# Patient Record
Sex: Male | Born: 1970 | Race: White | Hispanic: No | Marital: Single | Smoking: Never smoker
Health system: Southern US, Community
[De-identification: ages and names within clinical notes are randomized; demographics above are authoritative.]

## PROBLEM LIST (undated history)

## (undated) DIAGNOSIS — R569 Unspecified convulsions: Secondary | ICD-10-CM

## (undated) DIAGNOSIS — E7800 Pure hypercholesterolemia, unspecified: Secondary | ICD-10-CM

## (undated) DIAGNOSIS — M1990 Unspecified osteoarthritis, unspecified site: Secondary | ICD-10-CM

## (undated) DIAGNOSIS — I10 Essential (primary) hypertension: Secondary | ICD-10-CM

## (undated) DIAGNOSIS — K760 Fatty (change of) liver, not elsewhere classified: Secondary | ICD-10-CM

## (undated) DIAGNOSIS — G809 Cerebral palsy, unspecified: Secondary | ICD-10-CM

## (undated) DIAGNOSIS — M109 Gout, unspecified: Secondary | ICD-10-CM

## (undated) DIAGNOSIS — G40909 Epilepsy, unspecified, not intractable, without status epilepticus: Secondary | ICD-10-CM

## (undated) DIAGNOSIS — F419 Anxiety disorder, unspecified: Secondary | ICD-10-CM

## (undated) HISTORY — PX: NOSE SURGERY: SHX723

## (undated) HISTORY — PX: ELBOW SURGERY: SHX618

## (undated) HISTORY — PX: BRAIN SURGERY: SHX531

## (undated) HISTORY — PX: LEG SURGERY: SHX1003

## (undated) HISTORY — PX: TONSILLECTOMY AND ADENOIDECTOMY: SHX28

## (undated) HISTORY — PX: THROAT SURGERY: SHX803

---

## 2011-01-27 DIAGNOSIS — G40209 Localization-related (focal) (partial) symptomatic epilepsy and epileptic syndromes with complex partial seizures, not intractable, without status epilepticus: Secondary | ICD-10-CM | POA: Insufficient documentation

## 2020-04-18 HISTORY — PX: COLONOSCOPY: SHX174

## 2020-10-07 ENCOUNTER — Emergency Department (HOSPITAL_COMMUNITY): Payer: Medicare Other

## 2020-10-07 ENCOUNTER — Inpatient Hospital Stay (HOSPITAL_COMMUNITY)
Admission: EM | Admit: 2020-10-07 | Discharge: 2020-10-12 | DRG: 481 | Disposition: A | Discharge status: Home Health | Payer: Medicare Other | Attending: Student | Admitting: Student

## 2020-10-07 ENCOUNTER — Other Ambulatory Visit: Payer: Self-pay

## 2020-10-07 DIAGNOSIS — D62 Acute posthemorrhagic anemia: Secondary | ICD-10-CM | POA: Diagnosis not present

## 2020-10-07 DIAGNOSIS — E7800 Pure hypercholesterolemia, unspecified: Secondary | ICD-10-CM | POA: Diagnosis present

## 2020-10-07 DIAGNOSIS — I10 Essential (primary) hypertension: Secondary | ICD-10-CM | POA: Diagnosis present

## 2020-10-07 DIAGNOSIS — Z20822 Contact with and (suspected) exposure to covid-19: Secondary | ICD-10-CM | POA: Diagnosis present

## 2020-10-07 DIAGNOSIS — M25511 Pain in right shoulder: Secondary | ICD-10-CM | POA: Diagnosis present

## 2020-10-07 DIAGNOSIS — T148XXA Other injury of unspecified body region, initial encounter: Secondary | ICD-10-CM

## 2020-10-07 DIAGNOSIS — T1490XA Injury, unspecified, initial encounter: Secondary | ICD-10-CM

## 2020-10-07 DIAGNOSIS — Z79899 Other long term (current) drug therapy: Secondary | ICD-10-CM

## 2020-10-07 DIAGNOSIS — G809 Cerebral palsy, unspecified: Secondary | ICD-10-CM | POA: Diagnosis present

## 2020-10-07 DIAGNOSIS — S72422A Displaced fracture of lateral condyle of left femur, initial encounter for closed fracture: Secondary | ICD-10-CM

## 2020-10-07 DIAGNOSIS — S7290XA Unspecified fracture of unspecified femur, initial encounter for closed fracture: Secondary | ICD-10-CM | POA: Diagnosis present

## 2020-10-07 DIAGNOSIS — G40909 Epilepsy, unspecified, not intractable, without status epilepticus: Secondary | ICD-10-CM | POA: Diagnosis present

## 2020-10-07 DIAGNOSIS — S72302A Unspecified fracture of shaft of left femur, initial encounter for closed fracture: Principal | ICD-10-CM | POA: Diagnosis present

## 2020-10-07 DIAGNOSIS — Z419 Encounter for procedure for purposes other than remedying health state, unspecified: Secondary | ICD-10-CM

## 2020-10-07 DIAGNOSIS — S72462A Displaced supracondylar fracture with intracondylar extension of lower end of left femur, initial encounter for closed fracture: Secondary | ICD-10-CM | POA: Diagnosis not present

## 2020-10-07 DIAGNOSIS — Y92410 Unspecified street and highway as the place of occurrence of the external cause: Secondary | ICD-10-CM

## 2020-10-07 HISTORY — DX: Essential (primary) hypertension: I10

## 2020-10-07 HISTORY — DX: Unspecified convulsions: R56.9

## 2020-10-07 HISTORY — DX: Cerebral palsy, unspecified: G80.9

## 2020-10-07 HISTORY — DX: Pure hypercholesterolemia, unspecified: E78.00

## 2020-10-07 HISTORY — DX: Epilepsy, unspecified, not intractable, without status epilepticus: G40.909

## 2020-10-07 LAB — COMPREHENSIVE METABOLIC PANEL
ALT: 104 U/L — ABNORMAL HIGH (ref 0–44)
AST: 187 U/L — ABNORMAL HIGH (ref 15–41)
Albumin: 3.3 g/dL — ABNORMAL LOW (ref 3.5–5.0)
Alkaline Phosphatase: 123 U/L (ref 38–126)
Anion gap: 8 (ref 5–15)
BUN: 10 mg/dL (ref 6–20)
CO2: 20 mmol/L — ABNORMAL LOW (ref 22–32)
Calcium: 8.3 mg/dL — ABNORMAL LOW (ref 8.9–10.3)
Chloride: 111 mmol/L (ref 98–111)
Creatinine, Ser: 0.92 mg/dL (ref 0.61–1.24)
GFR, Estimated: >60 mL/min (ref >60)
Glucose, Bld: 131 mg/dL — ABNORMAL HIGH (ref 70–99)
Potassium: 3.6 mmol/L (ref 3.5–5.1)
Sodium: 139 mmol/L (ref 135–145)
Total Bilirubin: 0.7 mg/dL (ref 0.3–1.2)
Total Protein: 6.4 g/dL — ABNORMAL LOW (ref 6.5–8.1)

## 2020-10-07 LAB — I-STAT CHEM 8, ED
BUN: 10 mg/dL (ref 6–20)
Calcium, Ion: 1.08 mmol/L — ABNORMAL LOW (ref 1.15–1.40)
Chloride: 109 mmol/L (ref 98–111)
Creatinine, Ser: 0.8 mg/dL (ref 0.61–1.24)
Glucose, Bld: 131 mg/dL — ABNORMAL HIGH (ref 70–99)
HCT: 41 % (ref 39.0–52.0)
Hemoglobin: 13.9 g/dL (ref 13.0–17.0)
Potassium: 3.8 mmol/L (ref 3.5–5.1)
Sodium: 143 mmol/L (ref 135–145)
TCO2: 23 mmol/L (ref 22–32)

## 2020-10-07 LAB — URINALYSIS, ROUTINE W REFLEX MICROSCOPIC
Bacteria, UA: NONE SEEN
Bilirubin Urine: NEGATIVE
Glucose, UA: NEGATIVE mg/dL
Ketones, ur: NEGATIVE mg/dL
Leukocytes,Ua: NEGATIVE
Nitrite: NEGATIVE
Protein, ur: NEGATIVE mg/dL
Specific Gravity, Urine: >1.046 — ABNORMAL HIGH (ref 1.005–1.030)
pH: 5 (ref 5.0–8.0)

## 2020-10-07 LAB — CBC
HCT: 41.4 % (ref 39.0–52.0)
Hemoglobin: 13.5 g/dL (ref 13.0–17.0)
MCH: 31 pg (ref 26.0–34.0)
MCHC: 32.6 g/dL (ref 30.0–36.0)
MCV: 95.2 fL (ref 80.0–100.0)
Platelets: 205 10*3/uL (ref 150–400)
RBC: 4.35 MIL/uL (ref 4.22–5.81)
RDW: 11.9 % (ref 11.5–15.5)
WBC: 15.3 10*3/uL — ABNORMAL HIGH (ref 4.0–10.5)
nRBC: 0 % (ref 0.0–0.2)

## 2020-10-07 LAB — PROTIME-INR
INR: 1.2 (ref 0.8–1.2)
Prothrombin Time: 15 seconds (ref 11.4–15.2)

## 2020-10-07 LAB — SAMPLE TO BLOOD BANK

## 2020-10-07 LAB — RESP PANEL BY RT-PCR (FLU A&B, COVID) ARPGX2
Influenza A by PCR: NEGATIVE
Influenza B by PCR: NEGATIVE
SARS Coronavirus 2 by RT PCR: NEGATIVE

## 2020-10-07 LAB — ETHANOL: Alcohol, Ethyl (B): <10 mg/dL (ref <10)

## 2020-10-07 MED ORDER — HEPARIN SODIUM (PORCINE) 5000 UNIT/ML IJ SOLN: 5000.0000 [IU] | Freq: Three times a day (TID) | INTRAMUSCULAR | Status: DC

## 2020-10-07 MED ORDER — HYDROCODONE-ACETAMINOPHEN 5-325 MG PO TABS: 1.0000 | ORAL_TABLET | ORAL | Status: DC | PRN

## 2020-10-07 MED ORDER — MORPHINE SULFATE (PF) 2 MG/ML IV SOLN
0.5000 mg | INTRAVENOUS | Status: DC | PRN
  Administered 2020-10-07 – 2020-10-08 (×4): 1 mg via INTRAVENOUS
  Filled 2020-10-07 (×4): qty 1

## 2020-10-07 MED ORDER — TRANEXAMIC ACID-NACL 1000-0.7 MG/100ML-% IV SOLN
1000.0000 mg | INTRAVENOUS | Status: AC
  Administered 2020-10-08: 1000 mg via INTRAVENOUS
  Filled 2020-10-07: qty 100

## 2020-10-07 MED ORDER — LACTATED RINGERS IV BOLUS
1000.0000 mL | Freq: Once | INTRAVENOUS | Status: AC
  Administered 2020-10-07: 1000 mL via INTRAVENOUS

## 2020-10-07 MED ORDER — SODIUM CHLORIDE 0.9 % IV SOLN: INTRAVENOUS | Status: DC

## 2020-10-07 MED ORDER — METHOCARBAMOL 500 MG PO TABS
500.0000 mg | ORAL_TABLET | Freq: Four times a day (QID) | ORAL | Status: DC | PRN
  Administered 2020-10-08 – 2020-10-11 (×3): 500 mg via ORAL
  Filled 2020-10-07 (×3): qty 1

## 2020-10-07 MED ORDER — POVIDONE-IODINE 10 % EX SWAB: 2.0000 "application " | Freq: Once | CUTANEOUS | Status: DC

## 2020-10-07 MED ORDER — METHOCARBAMOL 1000 MG/10ML IJ SOLN: 500.0000 mg | Freq: Four times a day (QID) | INTRAVENOUS | Status: DC | PRN

## 2020-10-07 MED ORDER — HYDROMORPHONE HCL 1 MG/ML IJ SOLN
1.0000 mg | INTRAMUSCULAR | Status: AC | PRN
  Administered 2020-10-07 – 2020-10-08 (×3): 1 mg via INTRAVENOUS
  Filled 2020-10-07 (×3): qty 1

## 2020-10-07 MED ORDER — CHLORHEXIDINE GLUCONATE 4 % EX LIQD: 60.0000 mL | Freq: Once | CUTANEOUS | Status: DC

## 2020-10-07 MED ORDER — HYDROMORPHONE HCL 1 MG/ML IJ SOLN
1.0000 mg | Freq: Once | INTRAMUSCULAR | Status: AC
Start: 2020-10-07 — End: 2020-10-07
  Administered 2020-10-07: 1 mg via INTRAVENOUS
  Filled 2020-10-07: qty 1

## 2020-10-07 MED ORDER — CEFAZOLIN SODIUM-DEXTROSE 2-4 GM/100ML-% IV SOLN
2.0000 g | INTRAVENOUS | Status: AC
  Administered 2020-10-08: 2 g via INTRAVENOUS
  Filled 2020-10-07: qty 100

## 2020-10-07 MED ORDER — HEPARIN SODIUM (PORCINE) 5000 UNIT/ML IJ SOLN
5000.0000 [IU] | Freq: Three times a day (TID) | INTRAMUSCULAR | Status: DC
  Administered 2020-10-08: 5000 [IU] via SUBCUTANEOUS
  Filled 2020-10-07: qty 1

## 2020-10-07 MED ORDER — CELECOXIB 200 MG PO CAPS
200.0000 mg | ORAL_CAPSULE | Freq: Two times a day (BID) | ORAL | Status: DC
  Administered 2020-10-07 – 2020-10-12 (×10): 200 mg via ORAL
  Filled 2020-10-07 (×11): qty 1

## 2020-10-07 MED ORDER — IOHEXOL 300 MG/ML  SOLN
100.0000 mL | Freq: Once | INTRAMUSCULAR | Status: AC | PRN
  Administered 2020-10-07: 100 mL via INTRAVENOUS
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
## 2020-10-07 NOTE — ED Notes (Signed)
Orthopedic surgeon, Dr. Berdine Addison, cleared c-spine and removed c-collar from patient.

## 2020-10-07 NOTE — ED Notes (Signed)
CT notified of need for scans

## 2020-10-07 NOTE — Progress Notes (Signed)
Orthopedic Tech Progress Note Patient Details:  Charles Brady 07-31-1970 [id number]  Ortho Devices Type of Ortho Device: Shoulder immobilizer, Knee Immobilizer Ortho Device/Splint Location: LLE,RUE Ortho Device/Splint Interventions: Ordered, Application, Adjustment   Post Interventions Patient Tolerated: Fair Instructions Provided: Adjustment of device, Care of device, Poper ambulation with device  Zaydin Billey 10/07/2020, 4:05 PM

## 2020-10-07 NOTE — ED Notes (Signed)
Pt's family updated per his request.

## 2020-10-07 NOTE — ED Notes (Signed)
Patient transported to CT 
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
## 2020-10-07 NOTE — ED Notes (Signed)
Pt given drink. Family at bedisde.

## 2020-10-08 ENCOUNTER — Inpatient Hospital Stay (HOSPITAL_COMMUNITY): Payer: Medicare Other | Admitting: Certified Registered"

## 2020-10-08 ENCOUNTER — Encounter (HOSPITAL_COMMUNITY)
Admission: EM | Disposition: A | Discharge status: Home Health | Payer: Self-pay | Source: Home / Self Care | Attending: Student

## 2020-10-08 ENCOUNTER — Encounter (HOSPITAL_COMMUNITY): Payer: Self-pay | Admitting: Orthopedic Surgery

## 2020-10-08 ENCOUNTER — Inpatient Hospital Stay (HOSPITAL_COMMUNITY): Payer: Medicare Other

## 2020-10-08 HISTORY — PX: FEMUR IM NAIL: SHX1597

## 2020-10-08 LAB — RAPID URINE DRUG SCREEN, HOSP PERFORMED
Amphetamines: NOT DETECTED
Barbiturates: NOT DETECTED
Benzodiazepines: POSITIVE — AB
Cocaine: NOT DETECTED
Opiates: POSITIVE — AB
Tetrahydrocannabinol: POSITIVE — AB

## 2020-10-08 LAB — HIV ANTIBODY (ROUTINE TESTING W REFLEX): HIV Screen 4th Generation wRfx: NONREACTIVE

## 2020-10-08 SURGERY — INSERTION, INTRAMEDULLARY ROD, FEMUR, RETROGRADE
Anesthesia: General | Laterality: Left

## 2020-10-08 MED ORDER — EPHEDRINE 5 MG/ML INJ
INTRAVENOUS | Status: AC
  Filled 2020-10-08: qty 10

## 2020-10-08 MED ORDER — POTASSIUM CHLORIDE IN NACL 20-0.9 MEQ/L-% IV SOLN
INTRAVENOUS | Status: DC
  Filled 2020-10-08: qty 1000

## 2020-10-08 MED ORDER — FENTANYL CITRATE (PF) 250 MCG/5ML IJ SOLN
INTRAMUSCULAR | Status: DC | PRN
  Administered 2020-10-08: 25 ug via INTRAVENOUS
  Administered 2020-10-08 (×2): 50 ug via INTRAVENOUS
  Administered 2020-10-08: 25 ug via INTRAVENOUS
  Administered 2020-10-08: 100 ug via INTRAVENOUS

## 2020-10-08 MED ORDER — LAMOTRIGINE 100 MG PO TABS
200.0000 mg | ORAL_TABLET | ORAL | Status: AC
  Administered 2020-10-08: 200 mg via ORAL
  Filled 2020-10-08: qty 2

## 2020-10-08 MED ORDER — PHENYLEPHRINE 40 MCG/ML (10ML) SYRINGE FOR IV PUSH (FOR BLOOD PRESSURE SUPPORT)
PREFILLED_SYRINGE | INTRAVENOUS | Status: AC
  Filled 2020-10-08: qty 20

## 2020-10-08 MED ORDER — PHENYLEPHRINE 40 MCG/ML (10ML) SYRINGE FOR IV PUSH (FOR BLOOD PRESSURE SUPPORT)
PREFILLED_SYRINGE | INTRAVENOUS | Status: DC | PRN
  Administered 2020-10-08: 40 ug via INTRAVENOUS
  Administered 2020-10-08 (×3): 120 ug via INTRAVENOUS

## 2020-10-08 MED ORDER — ACETAMINOPHEN 500 MG PO TABS
ORAL_TABLET | ORAL | Status: AC
  Administered 2020-10-08: 1000 mg via ORAL
  Filled 2020-10-08: qty 2

## 2020-10-08 MED ORDER — LAMOTRIGINE 100 MG PO TABS
200.0000 mg | ORAL_TABLET | Freq: Two times a day (BID) | ORAL | Status: DC
  Administered 2020-10-08 – 2020-10-12 (×8): 200 mg via ORAL
  Filled 2020-10-08 (×10): qty 2

## 2020-10-08 MED ORDER — LEVETIRACETAM 500 MG PO TABS
1000.0000 mg | ORAL_TABLET | ORAL | Status: AC
  Administered 2020-10-08: 1000 mg via ORAL
  Filled 2020-10-08: qty 2

## 2020-10-08 MED ORDER — LACTATED RINGERS IV SOLN: INTRAVENOUS | Status: DC | PRN

## 2020-10-08 MED ORDER — DEXAMETHASONE SODIUM PHOSPHATE 10 MG/ML IJ SOLN
INTRAMUSCULAR | Status: DC | PRN
  Administered 2020-10-08: 5 mg via INTRAVENOUS

## 2020-10-08 MED ORDER — OXYCODONE HCL 5 MG PO TABS
5.0000 mg | ORAL_TABLET | ORAL | Status: DC | PRN
  Administered 2020-10-09: 10 mg via ORAL
  Administered 2020-10-09: 5 mg via ORAL
  Administered 2020-10-09: 10 mg via ORAL
  Administered 2020-10-10: 5 mg via ORAL
  Administered 2020-10-11 – 2020-10-12 (×3): 10 mg via ORAL
  Filled 2020-10-08: qty 2
  Filled 2020-10-08 (×2): qty 1
  Filled 2020-10-08 (×4): qty 2

## 2020-10-08 MED ORDER — ROCURONIUM BROMIDE 10 MG/ML (PF) SYRINGE
PREFILLED_SYRINGE | INTRAVENOUS | Status: DC | PRN
  Administered 2020-10-08: 20 mg via INTRAVENOUS
  Administered 2020-10-08: 80 mg via INTRAVENOUS

## 2020-10-08 MED ORDER — 0.9 % SODIUM CHLORIDE (POUR BTL) OPTIME
TOPICAL | Status: DC | PRN
  Administered 2020-10-08: 1000 mL

## 2020-10-08 MED ORDER — GABAPENTIN 300 MG PO CAPS
600.0000 mg | ORAL_CAPSULE | Freq: Three times a day (TID) | ORAL | Status: DC
  Administered 2020-10-08 – 2020-10-12 (×11): 600 mg via ORAL
  Filled 2020-10-08 (×11): qty 2

## 2020-10-08 MED ORDER — METOCLOPRAMIDE HCL 5 MG/ML IJ SOLN
5.0000 mg | Freq: Three times a day (TID) | INTRAMUSCULAR | Status: DC | PRN
Start: 2020-10-08 — End: 2020-10-12

## 2020-10-08 MED ORDER — ONDANSETRON HCL 4 MG/2ML IJ SOLN
INTRAMUSCULAR | Status: AC
  Filled 2020-10-08: qty 2

## 2020-10-08 MED ORDER — DEXMEDETOMIDINE HCL IN NACL 200 MCG/50ML IV SOLN
INTRAVENOUS | Status: AC
  Filled 2020-10-08: qty 50

## 2020-10-08 MED ORDER — ACETAMINOPHEN 500 MG PO TABS
1000.0000 mg | ORAL_TABLET | Freq: Four times a day (QID) | ORAL | Status: DC
  Administered 2020-10-08 – 2020-10-12 (×13): 1000 mg via ORAL
  Filled 2020-10-08 (×14): qty 2

## 2020-10-08 MED ORDER — PRAVASTATIN SODIUM 40 MG PO TABS
20.0000 mg | ORAL_TABLET | Freq: Every day | ORAL | Status: DC
  Administered 2020-10-08 – 2020-10-11 (×4): 20 mg via ORAL
  Filled 2020-10-08 (×4): qty 1

## 2020-10-08 MED ORDER — CEFAZOLIN SODIUM-DEXTROSE 2-4 GM/100ML-% IV SOLN
2.0000 g | Freq: Three times a day (TID) | INTRAVENOUS | Status: AC
  Administered 2020-10-08 – 2020-10-09 (×3): 2 g via INTRAVENOUS
  Filled 2020-10-08 (×3): qty 100

## 2020-10-08 MED ORDER — ROCURONIUM BROMIDE 10 MG/ML (PF) SYRINGE
PREFILLED_SYRINGE | INTRAVENOUS | Status: AC
  Filled 2020-10-08: qty 20

## 2020-10-08 MED ORDER — PROPOFOL 10 MG/ML IV BOLUS
INTRAVENOUS | Status: DC | PRN
  Administered 2020-10-08: 200 mg via INTRAVENOUS

## 2020-10-08 MED ORDER — LEVETIRACETAM 500 MG PO TABS
1000.0000 mg | ORAL_TABLET | Freq: Two times a day (BID) | ORAL | Status: DC
  Administered 2020-10-08 – 2020-10-12 (×8): 1000 mg via ORAL
  Filled 2020-10-08 (×8): qty 2

## 2020-10-08 MED ORDER — ONDANSETRON HCL 4 MG PO TABS: 4.0000 mg | ORAL_TABLET | Freq: Four times a day (QID) | ORAL | Status: DC | PRN

## 2020-10-08 MED ORDER — DOCUSATE SODIUM 100 MG PO CAPS
100.0000 mg | ORAL_CAPSULE | Freq: Two times a day (BID) | ORAL | Status: DC
  Administered 2020-10-08 – 2020-10-12 (×8): 100 mg via ORAL
  Filled 2020-10-08 (×8): qty 1

## 2020-10-08 MED ORDER — METOCLOPRAMIDE HCL 5 MG PO TABS: 5.0000 mg | ORAL_TABLET | Freq: Three times a day (TID) | ORAL | Status: DC | PRN

## 2020-10-08 MED ORDER — DEXAMETHASONE SODIUM PHOSPHATE 10 MG/ML IJ SOLN
INTRAMUSCULAR | Status: AC
  Filled 2020-10-08: qty 1

## 2020-10-08 MED ORDER — FENTANYL CITRATE (PF) 100 MCG/2ML IJ SOLN
INTRAMUSCULAR | Status: AC
  Administered 2020-10-08: 25 ug via INTRAVENOUS
  Filled 2020-10-08: qty 2

## 2020-10-08 MED ORDER — ONDANSETRON HCL 4 MG/2ML IJ SOLN
INTRAMUSCULAR | Status: DC | PRN
  Administered 2020-10-08: 4 mg via INTRAVENOUS

## 2020-10-08 MED ORDER — SUGAMMADEX SODIUM 200 MG/2ML IV SOLN
INTRAVENOUS | Status: DC | PRN
  Administered 2020-10-08 (×2): 100 mg via INTRAVENOUS

## 2020-10-08 MED ORDER — CEFAZOLIN SODIUM 1 G IJ SOLR
INTRAMUSCULAR | Status: AC
  Filled 2020-10-08: qty 20

## 2020-10-08 MED ORDER — LIDOCAINE 2% (20 MG/ML) 5 ML SYRINGE
INTRAMUSCULAR | Status: DC | PRN
  Administered 2020-10-08: 60 mg via INTRAVENOUS

## 2020-10-08 MED ORDER — SUCCINYLCHOLINE CHLORIDE 200 MG/10ML IV SOSY
PREFILLED_SYRINGE | INTRAVENOUS | Status: AC
  Filled 2020-10-08: qty 10

## 2020-10-08 MED ORDER — ACETAMINOPHEN 500 MG PO TABS: 1000.0000 mg | ORAL_TABLET | Freq: Once | ORAL | Status: AC

## 2020-10-08 MED ORDER — SCOPOLAMINE 1 MG/3DAYS TD PT72
MEDICATED_PATCH | TRANSDERMAL | Status: AC
  Filled 2020-10-08: qty 1

## 2020-10-08 MED ORDER — LIDOCAINE 2% (20 MG/ML) 5 ML SYRINGE
INTRAMUSCULAR | Status: AC
  Filled 2020-10-08: qty 15

## 2020-10-08 MED ORDER — FENTANYL CITRATE (PF) 100 MCG/2ML IJ SOLN
25.0000 ug | INTRAMUSCULAR | Status: DC | PRN
  Administered 2020-10-08: 50 ug via INTRAVENOUS
  Administered 2020-10-08: 25 ug via INTRAVENOUS

## 2020-10-08 MED ORDER — ONDANSETRON HCL 4 MG/2ML IJ SOLN: 4.0000 mg | Freq: Four times a day (QID) | INTRAMUSCULAR | Status: DC | PRN

## 2020-10-08 MED ORDER — EPHEDRINE SULFATE-NACL 50-0.9 MG/10ML-% IV SOSY
PREFILLED_SYRINGE | INTRAVENOUS | Status: DC | PRN
  Administered 2020-10-08 (×2): 10 mg via INTRAVENOUS

## 2020-10-08 MED ORDER — PHENYLEPHRINE HCL-NACL 20-0.9 MG/250ML-% IV SOLN
INTRAVENOUS | Status: DC | PRN
  Administered 2020-10-08: 50 ug/min via INTRAVENOUS

## 2020-10-08 MED ORDER — ALPRAZOLAM 0.5 MG PO TABS
1.0000 mg | ORAL_TABLET | Freq: Two times a day (BID) | ORAL | Status: DC
  Administered 2020-10-08 – 2020-10-12 (×8): 1 mg via ORAL
  Filled 2020-10-08 (×8): qty 2

## 2020-10-08 MED ORDER — CHLORHEXIDINE GLUCONATE 0.12 % MT SOLN
OROMUCOSAL | Status: AC
  Administered 2020-10-08: 15 mL
  Filled 2020-10-08: qty 15

## 2020-10-08 MED ORDER — BACLOFEN 10 MG PO TABS
10.0000 mg | ORAL_TABLET | Freq: Three times a day (TID) | ORAL | Status: DC
  Administered 2020-10-08 – 2020-10-12 (×11): 10 mg via ORAL
  Filled 2020-10-08 (×14): qty 1

## 2020-10-08 MED ORDER — PROPOFOL 10 MG/ML IV BOLUS
INTRAVENOUS | Status: AC
  Filled 2020-10-08: qty 20

## 2020-10-08 MED ORDER — TRAZODONE HCL 50 MG PO TABS
150.0000 mg | ORAL_TABLET | Freq: Every day | ORAL | Status: DC
  Administered 2020-10-08 – 2020-10-11 (×4): 150 mg via ORAL
  Filled 2020-10-08 (×4): qty 3

## 2020-10-08 MED ORDER — ALLOPURINOL 100 MG PO TABS
100.0000 mg | ORAL_TABLET | Freq: Every morning | ORAL | Status: DC
  Administered 2020-10-09 – 2020-10-12 (×4): 100 mg via ORAL
  Filled 2020-10-08 (×4): qty 1

## 2020-10-08 MED ORDER — MIDAZOLAM HCL 2 MG/2ML IJ SOLN
INTRAMUSCULAR | Status: AC
  Filled 2020-10-08: qty 2

## 2020-10-08 MED ORDER — FENTANYL CITRATE (PF) 250 MCG/5ML IJ SOLN
INTRAMUSCULAR | Status: AC
  Filled 2020-10-08: qty 5

## 2020-10-08 MED ORDER — AMLODIPINE BESYLATE 10 MG PO TABS
10.0000 mg | ORAL_TABLET | Freq: Every morning | ORAL | Status: DC
  Administered 2020-10-09 – 2020-10-12 (×4): 10 mg via ORAL
  Filled 2020-10-08 (×4): qty 1

## 2020-10-08 MED ORDER — ENOXAPARIN SODIUM 40 MG/0.4ML IJ SOSY
40.0000 mg | PREFILLED_SYRINGE | INTRAMUSCULAR | Status: DC
  Filled 2020-10-08: qty 0.4

## 2020-10-08 MED ORDER — POLYETHYLENE GLYCOL 3350 17 G PO PACK
17.0000 g | PACK | Freq: Every day | ORAL | Status: DC | PRN
  Administered 2020-10-10 – 2020-10-11 (×2): 17 g via ORAL
  Filled 2020-10-08 (×2): qty 1

## 2020-10-08 SURGICAL SUPPLY — 65 items
BAG COUNTER SPONGE SURGICOUNT (BAG) ×2 IMPLANT
BIT DRILL CALIBRATED 4.2 (BIT) ×1 IMPLANT
BIT DRILL SHORT 4.2 (BIT) ×2 IMPLANT
BNDG ELASTIC 4X5.8 VLCR STR LF (GAUZE/BANDAGES/DRESSINGS) ×2 IMPLANT
BNDG ELASTIC 6X10 VLCR STRL LF (GAUZE/BANDAGES/DRESSINGS) ×2 IMPLANT
BNDG ELASTIC 6X5.8 VLCR STR LF (GAUZE/BANDAGES/DRESSINGS) ×2 IMPLANT
BRUSH SCRUB EZ PLAIN DRY (MISCELLANEOUS) ×4 IMPLANT
CHLORAPREP W/TINT 26 (MISCELLANEOUS) ×2 IMPLANT
CLSR STERI-STRIP ANTIMIC 1/2X4 (GAUZE/BANDAGES/DRESSINGS) ×2 IMPLANT
COVER MAYO STAND STRL (DRAPES) ×2 IMPLANT
COVER SURGICAL LIGHT HANDLE (MISCELLANEOUS) ×4 IMPLANT
DERMABOND ADVANCED (GAUZE/BANDAGES/DRESSINGS) ×1
DERMABOND ADVANCED .7 DNX12 (GAUZE/BANDAGES/DRESSINGS) ×1 IMPLANT
DRAPE C-ARM 42X72 X-RAY (DRAPES) ×2 IMPLANT
DRAPE C-ARMOR (DRAPES) ×2 IMPLANT
DRAPE HALF SHEET 40X57 (DRAPES) ×4 IMPLANT
DRAPE IMP U-DRAPE 54X76 (DRAPES) ×4 IMPLANT
DRAPE INCISE IOBAN 66X45 STRL (DRAPES) ×2 IMPLANT
DRAPE ORTHO SPLIT 77X108 STRL (DRAPES) ×2
DRAPE SURG 17X23 STRL (DRAPES) ×2 IMPLANT
DRAPE SURG ORHT 6 SPLT 77X108 (DRAPES) ×2 IMPLANT
DRAPE U-SHAPE 47X51 STRL (DRAPES) ×2 IMPLANT
DRESSING MEPILEX FLEX 4X4 (GAUZE/BANDAGES/DRESSINGS) ×1 IMPLANT
DRILL BIT CALIBRATED 4.2 (BIT) ×2
DRILL BIT SHORT 4.2 (BIT) ×2
DRSG MEPILEX FLEX 4X4 (GAUZE/BANDAGES/DRESSINGS) ×2
ELECT REM PT RETURN 9FT ADLT (ELECTROSURGICAL) ×2
ELECTRODE REM PT RTRN 9FT ADLT (ELECTROSURGICAL) ×1 IMPLANT
GAUZE SPONGE 4X4 12PLY STRL (GAUZE/BANDAGES/DRESSINGS) ×2 IMPLANT
GLOVE SURG ENC MOIS LTX SZ6.5 (GLOVE) ×6 IMPLANT
GLOVE SURG ENC MOIS LTX SZ7.5 (GLOVE) ×8 IMPLANT
GLOVE SURG UNDER POLY LF SZ6.5 (GLOVE) ×2 IMPLANT
GLOVE SURG UNDER POLY LF SZ7.5 (GLOVE) ×2 IMPLANT
GOWN STRL REUS W/ TWL LRG LVL3 (GOWN DISPOSABLE) ×2 IMPLANT
GOWN STRL REUS W/TWL LRG LVL3 (GOWN DISPOSABLE) ×2
GUIDEWIRE 3.2X400 (WIRE) ×2 IMPLANT
KIT BASIN OR (CUSTOM PROCEDURE TRAY) ×2 IMPLANT
KIT TURNOVER KIT B (KITS) ×2 IMPLANT
NAIL TIB RFNA BEND 10X380 5D (Nail) ×2 IMPLANT
PACK ORTHO EXTREMITY (CUSTOM PROCEDURE TRAY) ×2 IMPLANT
PAD ARMBOARD 7.5X6 YLW CONV (MISCELLANEOUS) ×4 IMPLANT
PAD CAST 4YDX4 CTTN HI CHSV (CAST SUPPLIES) ×1 IMPLANT
PADDING CAST ABS 6INX4YD NS (CAST SUPPLIES) ×1
PADDING CAST ABS COTTON 6X4 NS (CAST SUPPLIES) ×1 IMPLANT
PADDING CAST COTTON 4X4 STRL (CAST SUPPLIES) ×1
PADDING CAST COTTON 6X4 STRL (CAST SUPPLIES) ×2 IMPLANT
REAMER ROD DEEP FLUTE 2.5X950 (INSTRUMENTS) ×2 IMPLANT
SCREW LOCK IM 5X86 (Screw) ×2 IMPLANT
SCREW LOCK IM 5X90 (Screw) ×2 IMPLANT
SCREW LOCK IM NAIL 5X48 (Screw) ×2 IMPLANT
SCREW LOCK IM NAIL 5X84 (Screw) ×2 IMPLANT
SCREW LOCK IM NL 5X76 (Screw) ×2 IMPLANT
SCREW LOCK IM TI 5X40 (Screw) ×2 IMPLANT
SPONGE T-LAP 18X18 ~~LOC~~+RFID (SPONGE) ×2 IMPLANT
STAPLER VISISTAT 35W (STAPLE) ×2 IMPLANT
STRIP CLOSURE SKIN 1/2X4 (GAUZE/BANDAGES/DRESSINGS) ×2 IMPLANT
SUT MNCRL AB 3-0 PS2 18 (SUTURE) ×2 IMPLANT
SUT VIC AB 0 CT1 27 (SUTURE) ×1
SUT VIC AB 0 CT1 27XBRD ANBCTR (SUTURE) ×1 IMPLANT
SUT VIC AB 2-0 CT1 27 (SUTURE) ×2
SUT VIC AB 2-0 CT1 TAPERPNT 27 (SUTURE) ×2 IMPLANT
TOWEL GREEN STERILE (TOWEL DISPOSABLE) ×4 IMPLANT
TOWEL GREEN STERILE FF (TOWEL DISPOSABLE) ×2 IMPLANT
TUBE CONNECTING 12X1/4 (SUCTIONS) ×2 IMPLANT
YANKAUER SUCT BULB TIP NO VENT (SUCTIONS) ×2 IMPLANT

## 2020-10-08 NOTE — Anesthesia Procedure Notes (Signed)
Procedure Name: Intubation Date/Time: 10/08/2020 3:30 PM Performed by: Myna Bright, CRNA Pre-anesthesia Checklist: Patient identified, Emergency Drugs available, Suction available and Patient being monitored Patient Re-evaluated:Patient Re-evaluated prior to induction Oxygen Delivery Method: Circle system utilized Preoxygenation: Pre-oxygenation with 100% oxygen Induction Type: IV induction, Rapid sequence and Cricoid Pressure applied Laryngoscope Size: Mac and 4 Grade View: Grade I Tube type: Oral Tube size: 7.5 mm Number of attempts: 1 Airway Equipment and Method: Stylet Placement Confirmation: ETT inserted through vocal cords under direct vision, positive ETCO2 and breath sounds checked- equal and bilateral Secured at: 22 cm Tube secured with: Tape Dental Injury: Teeth and Oropharynx as per pre-operative assessment

## 2020-10-08 NOTE — ED Notes (Signed)
Contacted Dr. Lorin Picket x 2 to see if he would like pt to have PO meds ordered despite NPO order. No answer. Will continue to hold for now.
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
## 2020-10-08 NOTE — TOC CAGE-AID Note (Signed)
Transition of Care [facility]) - CAGE-AID Screening   Patient Details  Name: Charles Brady MRN: [id number] Date of Birth: [birth year]    Hewitt Shorts, RN Trauma Response Nurse Phone Number: [phone] 10/08/2020, 1:20 PM    CAGE-AID Screening:  Substance Abuse Education Offered: No (denies alcohol and drug use)

## 2020-10-08 NOTE — Progress Notes (Signed)
Pt received in room 5N06. Alert and oriented and in no apparent distress at this time. Vitals completed and telemetry placed and called in to CCMD. Mother at bedside. Patient made comfortable.

## 2020-10-08 NOTE — Transfer of Care (Signed)
Immediate Anesthesia Transfer of Care Note  Patient: Charles Brady  Procedure(s) Performed: INTRAMEDULLARY (IM) RETROGRADE FEMORAL NAILING (Left)  Patient Location: PACU  Anesthesia Type:General  Level of Consciousness: awake, alert  and patient cooperative  Airway & Oxygen Therapy: Patient Spontanous Breathing and Patient connected to face mask oxygen  Post-op Assessment: Report given to RN and Post -op Vital signs reviewed and stable  Post vital signs: Reviewed  Last Vitals:  Vitals Value Taken Time  BP 124/89 10/08/20 1710  Temp 36.6 C 10/08/20 1710  Pulse 105 10/08/20 1713  Resp 8 10/08/20 1713  SpO2 98 % 10/08/20 1713  Vitals shown include unvalidated device data.  Last Pain:  Vitals:   10/08/20 1415  TempSrc: Oral  PainSc:          Complications: No notable events documented.
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
## 2020-10-08 NOTE — ED Notes (Signed)
Charles Brady, pts mother would like to be updated when pt goes to surgery [phone]

## 2020-10-09 ENCOUNTER — Encounter (HOSPITAL_COMMUNITY): Payer: Self-pay | Admitting: Student

## 2020-10-09 LAB — BASIC METABOLIC PANEL
Anion gap: 4 — ABNORMAL LOW (ref 5–15)
BUN: 8 mg/dL (ref 6–20)
CO2: 25 mmol/L (ref 22–32)
Calcium: 8.2 mg/dL — ABNORMAL LOW (ref 8.9–10.3)
Chloride: 105 mmol/L (ref 98–111)
Creatinine, Ser: 0.74 mg/dL (ref 0.61–1.24)
GFR, Estimated: >60 mL/min (ref >60)
Glucose, Bld: 136 mg/dL — ABNORMAL HIGH (ref 70–99)
Potassium: 4.2 mmol/L (ref 3.5–5.1)
Sodium: 134 mmol/L — ABNORMAL LOW (ref 135–145)

## 2020-10-09 LAB — CBC
HCT: 23.3 % — ABNORMAL LOW (ref 39.0–52.0)
Hemoglobin: 7.8 g/dL — ABNORMAL LOW (ref 13.0–17.0)
MCH: 32.1 pg (ref 26.0–34.0)
MCHC: 33.5 g/dL (ref 30.0–36.0)
MCV: 95.9 fL (ref 80.0–100.0)
Platelets: 97 10*3/uL — ABNORMAL LOW (ref 150–400)
RBC: 2.43 MIL/uL — ABNORMAL LOW (ref 4.22–5.81)
RDW: 11.8 % (ref 11.5–15.5)
WBC: 9.7 10*3/uL (ref 4.0–10.5)
nRBC: 0 % (ref 0.0–0.2)

## 2020-10-09 LAB — VITAMIN D 25 HYDROXY (VIT D DEFICIENCY, FRACTURES): Vit D, 25-Hydroxy: 47.15 ng/mL (ref 30–100)

## 2020-10-09 NOTE — Plan of Care (Signed)

## 2020-10-09 NOTE — Progress Notes (Addendum)
Pt was evaluated today with PT and at end of session when pt sitting in chair, pt described that his R shoulder is popping.  Will be restricted now to NWB and in a sling for RUE per ortho conversation, to reassess the joint after discharge in ortho office. Samul Dada, PT MS Acute Rehab Dept. Number: [facility] [id number] and [facility] [phone]
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
## 2020-10-09 NOTE — Plan of Care (Signed)
  Problem: Education: Goal: Knowledge of General Education information will improve Description: Including pain rating scale, medication(s)/side effects and non-pharmacologic comfort measures Outcome: Progressing   Problem: Clinical Measurements: Goal: Will remain free from infection Outcome: Progressing   Problem: Activity: Goal: Risk for activity intolerance will decrease Outcome: Progressing   Problem: Elimination: Goal: Will not experience complications related to bowel motility Outcome: Progressing   Problem: Pain Managment: Goal: General experience of comfort will improve Outcome: Progressing   Problem: Safety: Goal: Ability to remain free from injury will improve Outcome: Progressing   Problem: Skin Integrity: Goal: Risk for impaired skin integrity will decrease Outcome: Progressing   

## 2020-10-09 NOTE — Progress Notes (Signed)
PT Cancellation Note  Patient Details Name: Charles Brady MRN: [id number] DOB: 09/03/1970   Cancelled Treatment:    Reason Eval/Treat Not Completed: Active bedrest order   Ivar Drape 10/09/2020, 9:48 AM  Samul Dada, PT MS Acute Rehab Dept. Number: [facility] [id number] and [facility] [phone]

## 2020-10-09 NOTE — Anesthesia Postprocedure Evaluation (Signed)
Anesthesia Post Note  Patient: Charles Brady  Procedure(s) Performed: INTRAMEDULLARY (IM) RETROGRADE FEMORAL NAILING (Left)     Patient location during evaluation: PACU Anesthesia Type: General Level of consciousness: awake and alert Pain management: pain level controlled Vital Signs Assessment: post-procedure vital signs reviewed and stable Respiratory status: spontaneous breathing, nonlabored ventilation, respiratory function stable and patient connected to nasal cannula oxygen Cardiovascular status: blood pressure returned to baseline and stable Postop Assessment: no apparent nausea or vomiting Anesthetic complications: no   No notable events documented.  Last Vitals:  Vitals:   10/09/20 0459 10/09/20 0742  BP: 119/66 127/90  Pulse: 83 84  Resp: 15 17  Temp: 36.8 C 36.5 C  SpO2: 90% 96%    Last Pain:  Vitals:   10/09/20 0742  TempSrc: Oral  PainSc:                  Kennieth Rad

## 2020-10-10 ENCOUNTER — Encounter (HOSPITAL_COMMUNITY): Payer: Self-pay | Admitting: Orthopedic Surgery

## 2020-10-10 LAB — CBC
HCT: 18.5 % — ABNORMAL LOW (ref 39.0–52.0)
Hemoglobin: 6.2 g/dL — CL (ref 13.0–17.0)
MCH: 32 pg (ref 26.0–34.0)
MCHC: 33.5 g/dL (ref 30.0–36.0)
MCV: 95.4 fL (ref 80.0–100.0)
Platelets: 89 10*3/uL — ABNORMAL LOW (ref 150–400)
RBC: 1.94 MIL/uL — ABNORMAL LOW (ref 4.22–5.81)
RDW: 12 % (ref 11.5–15.5)
WBC: 8.7 10*3/uL (ref 4.0–10.5)
nRBC: 0.2 % (ref 0.0–0.2)

## 2020-10-10 LAB — BASIC METABOLIC PANEL
Anion gap: 6 (ref 5–15)
BUN: 10 mg/dL (ref 6–20)
CO2: 28 mmol/L (ref 22–32)
Calcium: 8.1 mg/dL — ABNORMAL LOW (ref 8.9–10.3)
Chloride: 101 mmol/L (ref 98–111)
Creatinine, Ser: 0.84 mg/dL (ref 0.61–1.24)
GFR, Estimated: >60 mL/min (ref >60)
Glucose, Bld: 116 mg/dL — ABNORMAL HIGH (ref 70–99)
Potassium: 3.5 mmol/L (ref 3.5–5.1)
Sodium: 135 mmol/L (ref 135–145)

## 2020-10-10 LAB — PREPARE RBC (CROSSMATCH)

## 2020-10-10 LAB — ABO/RH: ABO/RH(D): O POS

## 2020-10-10 LAB — HEMOGLOBIN AND HEMATOCRIT, BLOOD
HCT: 25.1 % — ABNORMAL LOW (ref 39.0–52.0)
Hemoglobin: 8.5 g/dL — ABNORMAL LOW (ref 13.0–17.0)

## 2020-10-10 MED ORDER — DIPHENHYDRAMINE HCL 25 MG PO CAPS
25.0000 mg | ORAL_CAPSULE | Freq: Once | ORAL | Status: AC
  Administered 2020-10-10: 25 mg via ORAL
  Filled 2020-10-10: qty 1

## 2020-10-10 MED ORDER — ACETAMINOPHEN 325 MG PO TABS
650.0000 mg | ORAL_TABLET | Freq: Once | ORAL | Status: AC
  Administered 2020-10-10: 650 mg via ORAL
  Filled 2020-10-10: qty 2

## 2020-10-10 MED ORDER — SODIUM CHLORIDE 0.9% IV SOLUTION: Freq: Once | INTRAVENOUS | Status: DC

## 2020-10-10 NOTE — Progress Notes (Signed)
Called regarding 6.2 Hgb result this morning. Type and screen has been performed, and patient already has signed consent for blood products. Ordered 2 units PRBC to be transfused.   Janine Ores, PA-C

## 2020-10-10 NOTE — Progress Notes (Signed)
PT Cancellation Note  Patient Details Name: Charles Brady MRN: [id number] DOB: 14-Apr-1970   Cancelled Treatment:    Reason Eval/Treat Not Completed: (P) Medical issues which prohibited therapy (HGB 6.2 will f/u when HGB has improved.)   Brooks Kinnan Artis Delay 10/10/2020, 11:42 AM  Bonney Leitz , PTA Acute Rehabilitation Services Pager [phone] Office [phone]

## 2020-10-10 NOTE — Plan of Care (Signed)
  Problem: Health Behavior/Discharge Planning: Goal: Ability to manage health-related needs will improve Outcome: Progressing   Problem: Activity: Goal: Risk for activity intolerance will decrease Outcome: Progressing   Problem: Pain Managment: Goal: General experience of comfort will improve Outcome: Progressing   

## 2020-10-10 NOTE — Progress Notes (Signed)
Orthopaedic Trauma Progress Note  SUBJECTIVE: Resting comfortably in bed. Tolerating diet and fluids. No other complaints.   OBJECTIVE:  Vitals:   10/10/20 0848 10/10/20 0913  BP: (!) 109/58 (P) 110/75  Pulse: 76 (P) 87  Resp: 19 (P) 19  Temp: 97.8 F (36.6 C) (P) 98.8 F (37.1 C)  SpO2: 96% (P) 96%    General: Sleeping in bed comfortably, no acute distress Respiratory: No increased work of breathing.  Left lower extremity: Dressing removed, incisions CDI with steri-strips in place. Compartments soft and compressible. Foot warm and well perfused  IMAGING: Stable post op imaging.   LABS:  Results for orders placed or performed during the hospital encounter of 10/07/20 (from the past 24 hour(s))  Basic metabolic panel     Status: Abnormal   Collection Time: 10/10/20  2:12 AM  Result Value Ref Range   Sodium 135 135 - 145 mmol/L   Potassium 3.5 3.5 - 5.1 mmol/L   Chloride 101 98 - 111 mmol/L   CO2 28 22 - 32 mmol/L   Glucose, Bld 116 (H) 70 - 99 mg/dL   BUN 10 6 - 20 mg/dL   Creatinine, Ser 1.61 0.61 - 1.24 mg/dL   Calcium 8.1 (L) 8.9 - 10.3 mg/dL   GFR, Estimated >09 >60 mL/min   Anion gap 6 5 - 15  CBC     Status: Abnormal   Collection Time: 10/10/20  2:12 AM  Result Value Ref Range   WBC 8.7 4.0 - 10.5 K/uL   RBC 1.94 (L) 4.22 - 5.81 MIL/uL   Hemoglobin 6.2 (LL) 13.0 - 17.0 g/dL   HCT 45.4 (L) 09.8 - 11.9 %   MCV 95.4 80.0 - 100.0 fL   MCH 32.0 26.0 - 34.0 pg   MCHC 33.5 30.0 - 36.0 g/dL   RDW 14.7 82.9 - 56.2 %   Platelets 89 (L) 150 - 400 K/uL   nRBC 0.2 0.0 - 0.2 %  ABO/Rh     Status: None   Collection Time: 10/10/20  2:12 AM  Result Value Ref Range   ABO/RH(D)      O POS Performed at [facility], [address] [place], [place] [address]   Prepare RBC (crossmatch)     Status: None   Collection Time: 10/10/20  3:34 AM  Result Value Ref Range   Order Confirmation      ORDER PROCESSED BY BLOOD BANK Performed at [facility], [address], [place], [place] [address]   Type and screen [facility]     Status: None (Preliminary result)   Collection Time: 10/10/20  5:00 AM  Result Value Ref Range   ABO/RH(D) O POS    Antibody Screen NEG    Sample Expiration 10/13/2020,2359    Unit Number [id number]    Blood Component Type RED CELLS,LR    Unit division 00    Status of Unit ALLOCATED    Transfusion Status OK TO TRANSFUSE    Crossmatch Result Compatible    Unit Number [id number]    Blood Component Type RED CELLS,LR    Unit division 00    Status of Unit ISSUED    Transfusion Status OK TO TRANSFUSE    Crossmatch Result      Compatible Performed at [facility], [address] [place], [place] [address]     ASSESSMENT: Charles Brady is a 50 y.o. male, 2 Days Post-Op s/p RETROGRADE INTRAMEDULLARY NAILING LEFT FEMUR  CV/Blood loss: Acute blood loss anemia, Hgb 6.2 this morning. 2 units PRBCs has been ordered for transfusion. Continue to monitor CBC  PLAN: Weightbearing: TDWB LLE Incisional and dressing care: Ok to leave open to air Showering: Okay to begin showering with assistance 10/12/2020 if no drainage from incisions Orthopedic device(s): None  Pain management:  1. Tylenol 1000 mg q 6 hours scheduled 2. Robaxin 500 mg q 6 hours PRN 3. Oxycodone 5-10 mg q 4 hours PRN 4. Neurontin 600 mg TID 5. Morphine 0.5-1 mg q 2 hours PRN VTE prophylaxis: Hold Lovenox today.  Continue with SCDs ID:  Ancef 2gm post op completed Foley/Lines:  No foley, KVO IVFs Impediments to Fracture Healing: Vitamin D level 47, no supplementation needed Dispo: PT/OT as tolerated, currently recommending SNF. Recheck post transfusion H&H.  Continue to monitor CBC.  We will hold Lovenox until hemoglobin stabilizes Follow - up plan: 2 weeks after d/c  Contact information:  Truitt Merle MD, Ulyses Southward PA-C. After hours and holidays please check [email or web address] for group call information for [facility]   Legend Pecore  A. Michaelyn Barter, PA-C [phone] (office) [email or web address]

## 2020-10-11 LAB — TYPE AND SCREEN
ABO/RH(D): O POS
Antibody Screen: NEGATIVE
Unit division: 0
Unit division: 0

## 2020-10-11 LAB — BASIC METABOLIC PANEL
Anion gap: 4 — ABNORMAL LOW (ref 5–15)
BUN: 9 mg/dL (ref 6–20)
CO2: 31 mmol/L (ref 22–32)
Calcium: 8.2 mg/dL — ABNORMAL LOW (ref 8.9–10.3)
Chloride: 105 mmol/L (ref 98–111)
Creatinine, Ser: 0.7 mg/dL (ref 0.61–1.24)
GFR, Estimated: >60 mL/min (ref >60)
Glucose, Bld: 103 mg/dL — ABNORMAL HIGH (ref 70–99)
Potassium: 3.5 mmol/L (ref 3.5–5.1)
Sodium: 140 mmol/L (ref 135–145)

## 2020-10-11 LAB — BPAM RBC
Blood Product Expiration Date: 202209032359
Blood Product Expiration Date: 202209032359
ISSUE DATE / TIME: 202208100838
ISSUE DATE / TIME: 202208101205
Unit Type and Rh: 5100
Unit Type and Rh: 5100

## 2020-10-11 LAB — CBC
HCT: 24.1 % — ABNORMAL LOW (ref 39.0–52.0)
Hemoglobin: 7.9 g/dL — ABNORMAL LOW (ref 13.0–17.0)
MCH: 30.7 pg (ref 26.0–34.0)
MCHC: 32.8 g/dL (ref 30.0–36.0)
MCV: 93.8 fL (ref 80.0–100.0)
Platelets: 120 10*3/uL — ABNORMAL LOW (ref 150–400)
RBC: 2.57 MIL/uL — ABNORMAL LOW (ref 4.22–5.81)
RDW: 13.4 % (ref 11.5–15.5)
WBC: 6.4 10*3/uL (ref 4.0–10.5)
nRBC: 1.1 % — ABNORMAL HIGH (ref 0.0–0.2)

## 2020-10-11 MED ORDER — ENOXAPARIN SODIUM 40 MG/0.4ML IJ SOSY
40.0000 mg | PREFILLED_SYRINGE | INTRAMUSCULAR | Status: DC
Start: 2020-10-11 — End: 2020-10-12
  Administered 2020-10-11: 40 mg via SUBCUTANEOUS
  Filled 2020-10-11 (×2): qty 0.4

## 2020-10-11 MED ORDER — FERROUS SULFATE 325 (65 FE) MG PO TABS
325.0000 mg | ORAL_TABLET | Freq: Every day | ORAL | Status: DC
  Administered 2020-10-12: 325 mg via ORAL
  Filled 2020-10-11: qty 1

## 2020-10-11 NOTE — Progress Notes (Signed)
Orthopaedic Trauma Progress Note  SUBJECTIVE: Doing well this morning. Pain remains controlled. Tolerating diet and fluids. No other complaints. Patient noted to have right shoulder pain on admission. He was instructed to be non-weightbearing and have the arm in a sling. Therapists have noted that if patient were to be non-weightbearing on RUE, this would make mobility much more difficult and would likely change d/c plan.   After discussion with patient this morning, he notes he has had pain in the right shoulder for several years. Pain at this point is unchanged from prior to admission. I have reviewed x-ray and CT scan of the shoulder done at time of hospital presentation and do not see any obvious fractures/dislocations involving the shoulder.    OBJECTIVE:  Vitals:   10/10/20 1446 10/11/20 0747  BP: 116/67 (!) 106/59  Pulse: 86 62  Resp: 16 17  Temp: 97.6 F (36.4 C) 98.4 F (36.9 C)  SpO2: 93% 92%    General: Sitting up in bed, no acute distress. Eating breakfast Respiratory: No increased work of breathing.  Left lower extremity: Incisions CDI with steri-strips in place. Ankle DF/PF intact. Compartments soft and compressible. Foot warm and well perfused Right Upper extremity: Skin without lesions. No significant tenderness with palpation about the shoulder.  Has full range of motion with forward elevation and abduction.  No instability of the shoulder noted.  Good strength.  Motor and sensory function intact.  Neurovascularly intact  IMAGING: Stable post op imaging left femur.   LABS:  Results for orders placed or performed during the hospital encounter of 10/07/20 (from the past 24 hour(s))  Hemoglobin and hematocrit, blood     Status: Abnormal   Collection Time: 10/10/20  4:23 PM  Result Value Ref Range   Hemoglobin 8.5 (L) 13.0 - 17.0 g/dL   HCT 23.5 (L) 57.3 - 22.0 %  Basic metabolic panel     Status: Abnormal   Collection Time: 10/11/20  2:50 AM  Result Value Ref Range    Sodium 140 135 - 145 mmol/L   Potassium 3.5 3.5 - 5.1 mmol/L   Chloride 105 98 - 111 mmol/L   CO2 31 22 - 32 mmol/L   Glucose, Bld 103 (H) 70 - 99 mg/dL   BUN 9 6 - 20 mg/dL   Creatinine, Ser 2.54 0.61 - 1.24 mg/dL   Calcium 8.2 (L) 8.9 - 10.3 mg/dL   GFR, Estimated >27 >06 mL/min   Anion gap 4 (L) 5 - 15    ASSESSMENT: Charles Brady is a 50 y.o. male, 3 Days Post-Op s/p RETROGRADE INTRAMEDULLARY NAILING LEFT FEMUR  CV/Blood loss: Acute blood loss anemia. Post transfusion Hgb 8.5 yesterday, CBC pending this AM.  PLAN: Weightbearing: TDWB LLE, WBAT RUE ROM: Ok for hip and knee ROM as tolerated. Unrestricted R shoulder motion as tolerated Incisional and dressing care: Ok to leave open to air Showering: Okay to begin showering with assistance  Orthopedic device(s): None  Pain management:  1. Tylenol 1000 mg q 6 hours scheduled 2. Robaxin 500 mg q 6 hours PRN 3. Oxycodone 5-10 mg q 4 hours PRN 4. Neurontin 600 mg TID 5. Morphine 0.5-1 mg q 2 hours PRN VTE prophylaxis: Start Lovenox today.  Continue with SCDs ID:  Ancef 2gm post op completed Foley/Lines:  No foley, KVO IVFs Impediments to Fracture Healing: Vitamin D level 47, no supplementation needed Dispo: PT/OT as tolerated, currently recommending SNF versus home health.  Patient okay to use right arm as tolerated when  ambulating with walker.  Will plan to reevaluate discharge plan after today's therapy sessions.  Follow - up plan: 2 weeks after d/c for repeat x-ray and wound check  Contact information:  Charles Merle MD, Charles Southward PA-C. After hours and holidays please check [email or web address] for group call information for [facility]   Charles Brady A. Charles Barter, PA-C [phone] (office) [email or web address]
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
## 2020-10-11 NOTE — Care Management Important Message (Signed)
Important Message  Patient Details  Name: Charles Brady MRN: [id number] Date of Birth: [birth year]   Medicare Important Message Given:  Yes     Shenita Trego 10/11/2020, 3:00 PM
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
## 2020-10-11 NOTE — TOC Initial Note (Addendum)
Transition of Care [facility]) - Initial/Assessment Note    Patient Details  Name: Charles Brady MRN: [id number] Date of Birth: 07/23/1970  [facility] CM/SW Contact:    Epifanio Lesches, RN Phone Number: 10/11/2020, 5:12 PM  Clinical Narrative:                 -  s/p IM Nailing  L FEMUR 8/8 From home with mom. NCM spoke with pt regarding d/c planning. Shared PT's evaluation/recommendations for HHPT. Pt stated he would like home health PT services. Offered choice . Pt without preference. Referral made with [facility], acceptance pending. Pt states doesn't work , on disability. NCM given verbal consent to speak with pt's mom concerning TOC needs. Pt states mom has lost her cell phone and it 's best to contact her on the home line, [phone]. NCM attempted call to mom, call unsuccessful. Unable to leave voice message.   TOC team following and will assist with needs......  10/12/2020 @ 8:38 am   [facility]ac[facility] accepted for home health.   Expected Discharge Plan: Home w Home Health Services (resides with mother) Barriers to Discharge: Continued Medical Work up   Patient Goals and CMS Choice        Expected Discharge Plan and Services Expected Discharge Plan: Home w Home Health Services (resides with mother)   Discharge Planning Services: CM Consult Post Acute Care Choice: Durable Medical Equipment (walker, crutches , and W/C)                             HH Arranged: PT HH Agency: [facility] Date HH Agency Contacted: 10/11/20 Time HH Agency Contacted: 1707 Representative spoke with at [facility] Agency: Stacie  Prior Living Arrangements/Services     Patient language and need for interpreter reviewed:: Yes Do you feel safe going back to the place where you live?: Yes      Need for Family Participation in Patient Care: Yes (Comment) Care giver support system in place?: Yes (comment)   Criminal Activity/Legal Involvement  Pertinent to Current Situation/Hospitalization: No - Comment as needed  Activities of Daily Living Home Assistive Devices/Equipment: Cane (specify quad or straight), Walker (specify type), Wheelchair ADL Screening (condition at time of admission) Patient's cognitive ability adequate to safely complete daily activities?: Yes Is the patient deaf or have difficulty hearing?: No Does the patient have difficulty seeing, even when wearing glasses/contacts?: No Does the patient have difficulty concentrating, remembering, or making decisions?: No Patient able to express need for assistance with ADLs?: Yes Does the patient have difficulty dressing or bathing?: No Independently performs ADLs?: Yes (appropriate for developmental age) Does the patient have difficulty walking or climbing stairs?: No Weakness of Legs: Both Weakness of Arms/Hands: Both  Permission Sought/Granted   Permission granted to share information with : Yes, Verbal Permission Granted  Share Information with NAME: Johnnie Goynes (Mother)  [phone]           Emotional Assessment Appearance:: Appears stated age Attitude/Demeanor/Rapport: Gracious Affect (typically observed): Accepting Orientation: : Oriented to Place, Oriented to Self, Oriented to  Time, Oriented to Situation Alcohol / Substance Use: Not Applicable Psych Involvement: No (comment)  Admission diagnosis:  Femur fracture (HCC) [S72.90XA] Trauma [T14.90XA] Closed bicondylar fracture of left femur, initial encounter (HCC) [O84.166A, S72.432A] Patient Active Problem List   Diagnosis Date Noted   Femur fracture (HCC) 10/07/2020   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

## 2020-10-12 LAB — CBC
HCT: 24.6 % — ABNORMAL LOW (ref 39.0–52.0)
Hemoglobin: 8 g/dL — ABNORMAL LOW (ref 13.0–17.0)
MCH: 30.7 pg (ref 26.0–34.0)
MCHC: 32.5 g/dL (ref 30.0–36.0)
MCV: 94.3 fL (ref 80.0–100.0)
Platelets: 154 10*3/uL (ref 150–400)
RBC: 2.61 MIL/uL — ABNORMAL LOW (ref 4.22–5.81)
RDW: 13.7 % (ref 11.5–15.5)
WBC: 8.3 10*3/uL (ref 4.0–10.5)
nRBC: 0.5 % — ABNORMAL HIGH (ref 0.0–0.2)

## 2020-10-12 LAB — GLUCOSE, CAPILLARY: Glucose-Capillary: 90 mg/dL (ref 70–99)

## 2020-10-12 MED ORDER — ENOXAPARIN SODIUM 40 MG/0.4ML IJ SOSY: 40.0000 mg | PREFILLED_SYRINGE | INTRAMUSCULAR | 0 refills | Status: DC

## 2020-10-12 MED ORDER — OXYCODONE-ACETAMINOPHEN 5-325 MG PO TABS: 1.0000 | ORAL_TABLET | ORAL | 0 refills | Status: AC | PRN

## 2020-10-12 MED ORDER — FERROUS SULFATE 325 (65 FE) MG PO TABS: 325.0000 mg | ORAL_TABLET | Freq: Every day | ORAL | 0 refills | Status: DC

## 2020-10-12 MED ORDER — DOCUSATE SODIUM 100 MG PO CAPS: 100.0000 mg | ORAL_CAPSULE | Freq: Two times a day (BID) | ORAL | 0 refills | Status: AC

## 2020-10-12 MED ORDER — CELECOXIB 200 MG PO CAPS: 200.0000 mg | ORAL_CAPSULE | Freq: Two times a day (BID) | ORAL | 0 refills | Status: AC
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
## 2020-10-18 ENCOUNTER — Telehealth: Payer: Self-pay | Admitting: Family Medicine
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
## 2020-11-27 ENCOUNTER — Ambulatory Visit: Payer: Self-pay | Admitting: Student

## 2020-11-27 DIAGNOSIS — S72432A Displaced fracture of medial condyle of left femur, initial encounter for closed fracture: Secondary | ICD-10-CM | POA: Insufficient documentation

## 2020-11-27 DIAGNOSIS — S72422A Displaced fracture of lateral condyle of left femur, initial encounter for closed fracture: Secondary | ICD-10-CM | POA: Insufficient documentation

## 2020-11-28 ENCOUNTER — Encounter (HOSPITAL_COMMUNITY): Payer: Self-pay | Admitting: Student

## 2020-11-28 NOTE — Progress Notes (Signed)
DUE TO COVID-19 ONLY ONE VISITOR IS ALLOWED TO COME WITH YOU AND STAY IN THE WAITING ROOM ONLY DURING PRE OP AND PROCEDURE DAY OF SURGERY.   Spoke with patient's mother Leanord Thibeau for PAT information and instructions for DOS.  PCP - Nash Shearer List, FNP Cardiologist - n/a Neurology - Dr Briscoe Deutscher O'Donovan  CT Chest x-ray - 10/07/20 EKG - 10/07/20 Stress Test - n/a ECHO - n/a Cardiac Cath - n/a  ICD Pacemaker/Loop - n/a  Sleep Study -  n/a CPAP - none  ERAS: Clear liquids til 4:30 am on DOS.  Clear liquids reviewed with patient's mother.   Anesthesia review: Yes  STOP now taking any Aspirin (unless otherwise instructed by your surgeon), Aleve, Naproxen, Ibuprofen, Motrin, Advil, Goody's, BC's, all herbal medications, fish oil, and all vitamins.   Coronavirus Screening Covid test n/a Ambulatory Surgery   Do the patient have any of the following symptoms:  Cough yes/no: No Fever (>100.80F)  yes/no: No Runny nose yes/no: No Sore throat yes/no: No Difficulty breathing/shortness of breath  yes/no: No  Have you traveled in the last 14 days and where? yes/no: No  Mother Dois Davenport verbalized understanding of instructions that were given via phone.
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
## 2020-11-29 NOTE — Anesthesia Preprocedure Evaluation (Addendum)
Anesthesia Evaluation  Patient identified by MRN, date of birth, ID band Patient awake    Reviewed: Allergy & Precautions, NPO status , Patient's Chart, lab work & pertinent test results  History of Anesthesia Complications Negative for: history of anesthetic complications  Airway Mallampati: III  TM Distance: >3 FB Neck ROM: Full    Dental  (+) Dental Advisory Given   Pulmonary neg pulmonary ROS,    Pulmonary exam normal        Cardiovascular hypertension, Pt. on medications Normal cardiovascular exam     Neuro/Psych Seizures -, Well Controlled,  PSYCHIATRIC DISORDERS Anxiety  Cerebral palsy Last seizure 05/2020, prior to that had been w/o seizures x3 yrs     GI/Hepatic negative GI ROS, (+)     substance abuse  marijuana use,   Endo/Other  negative endocrine ROS  Renal/GU negative Renal ROS     Musculoskeletal  (+) Arthritis ,  Gout    Abdominal   Peds  Hematology negative hematology ROS (+)   Anesthesia Other Findings   Reproductive/Obstetrics                           Anesthesia Physical Anesthesia Plan  ASA: 2  Anesthesia Plan: General   Post-op Pain Management:    Induction: Intravenous  PONV Risk Score and Plan: 2 and Treatment may vary due to age or medical condition, Ondansetron, Dexamethasone and Midazolam  Airway Management Planned: Oral ETT  Additional Equipment: None  Intra-op Plan:   Post-operative Plan: Extubation in OR  Informed Consent: I have reviewed the patients History and Physical, chart, labs and discussed the procedure including the risks, benefits and alternatives for the proposed anesthesia with the patient or authorized representative who has indicated his/her understanding and acceptance.     Dental advisory given  Plan Discussed with: CRNA and Anesthesiologist  Anesthesia Plan Comments: ( )      Anesthesia Quick Evaluation

## 2020-11-30 ENCOUNTER — Ambulatory Visit (HOSPITAL_COMMUNITY): Payer: Medicare Other | Admitting: Vascular Surgery

## 2020-11-30 ENCOUNTER — Other Ambulatory Visit: Payer: Self-pay

## 2020-11-30 ENCOUNTER — Encounter (HOSPITAL_COMMUNITY)
Admission: RE | Disposition: A | Discharge status: Home / Self Care | Payer: Self-pay | Source: Home / Self Care | Attending: Student

## 2020-11-30 ENCOUNTER — Encounter (HOSPITAL_COMMUNITY): Payer: Self-pay | Admitting: Student

## 2020-11-30 ENCOUNTER — Ambulatory Visit (HOSPITAL_COMMUNITY): Payer: Medicare Other

## 2020-11-30 ENCOUNTER — Ambulatory Visit (HOSPITAL_COMMUNITY)
Admission: RE | Admit: 2020-11-30 | Discharge: 2020-11-30 | Disposition: A | Discharge status: Home / Self Care | Payer: Medicare Other | Attending: Student | Admitting: Student

## 2020-11-30 DIAGNOSIS — T84125A Displacement of internal fixation device of left femur, initial encounter: Secondary | ICD-10-CM | POA: Diagnosis not present

## 2020-11-30 DIAGNOSIS — Z9682 Presence of neurostimulator: Secondary | ICD-10-CM | POA: Insufficient documentation

## 2020-11-30 DIAGNOSIS — I10 Essential (primary) hypertension: Secondary | ICD-10-CM | POA: Insufficient documentation

## 2020-11-30 DIAGNOSIS — K760 Fatty (change of) liver, not elsewhere classified: Secondary | ICD-10-CM | POA: Insufficient documentation

## 2020-11-30 DIAGNOSIS — G809 Cerebral palsy, unspecified: Secondary | ICD-10-CM | POA: Insufficient documentation

## 2020-11-30 DIAGNOSIS — Z419 Encounter for procedure for purposes other than remedying health state, unspecified: Secondary | ICD-10-CM

## 2020-11-30 DIAGNOSIS — E7800 Pure hypercholesterolemia, unspecified: Secondary | ICD-10-CM | POA: Diagnosis not present

## 2020-11-30 DIAGNOSIS — Y838 Other surgical procedures as the cause of abnormal reaction of the patient, or of later complication, without mention of misadventure at the time of the procedure: Secondary | ICD-10-CM | POA: Insufficient documentation

## 2020-11-30 DIAGNOSIS — F419 Anxiety disorder, unspecified: Secondary | ICD-10-CM | POA: Insufficient documentation

## 2020-11-30 DIAGNOSIS — S72432A Displaced fracture of medial condyle of left femur, initial encounter for closed fracture: Secondary | ICD-10-CM | POA: Insufficient documentation

## 2020-11-30 DIAGNOSIS — G40909 Epilepsy, unspecified, not intractable, without status epilepticus: Secondary | ICD-10-CM | POA: Insufficient documentation

## 2020-11-30 DIAGNOSIS — Z79899 Other long term (current) drug therapy: Secondary | ICD-10-CM | POA: Insufficient documentation

## 2020-11-30 DIAGNOSIS — S72422A Displaced fracture of lateral condyle of left femur, initial encounter for closed fracture: Secondary | ICD-10-CM | POA: Insufficient documentation

## 2020-11-30 HISTORY — DX: Anxiety disorder, unspecified: F41.9

## 2020-11-30 HISTORY — DX: Unspecified osteoarthritis, unspecified site: M19.90

## 2020-11-30 HISTORY — DX: Gout, unspecified: M10.9

## 2020-11-30 HISTORY — DX: Fatty (change of) liver, not elsewhere classified: K76.0

## 2020-11-30 HISTORY — PX: HARDWARE REMOVAL: SHX979

## 2020-11-30 LAB — COMPREHENSIVE METABOLIC PANEL
ALT: 50 U/L — ABNORMAL HIGH (ref 0–44)
AST: 47 U/L — ABNORMAL HIGH (ref 15–41)
Albumin: 3.5 g/dL (ref 3.5–5.0)
Alkaline Phosphatase: 161 U/L — ABNORMAL HIGH (ref 38–126)
Anion gap: 7 (ref 5–15)
BUN: 5 mg/dL — ABNORMAL LOW (ref 6–20)
CO2: 27 mmol/L (ref 22–32)
Calcium: 9.5 mg/dL (ref 8.9–10.3)
Chloride: 101 mmol/L (ref 98–111)
Creatinine, Ser: 0.81 mg/dL (ref 0.61–1.24)
GFR, Estimated: >60 mL/min (ref >60)
Glucose, Bld: 95 mg/dL (ref 70–99)
Potassium: 3.4 mmol/L — ABNORMAL LOW (ref 3.5–5.1)
Sodium: 135 mmol/L (ref 135–145)
Total Bilirubin: 0.8 mg/dL (ref 0.3–1.2)
Total Protein: 6.7 g/dL (ref 6.5–8.1)

## 2020-11-30 LAB — CBC
HCT: 41.5 % (ref 39.0–52.0)
Hemoglobin: 13.4 g/dL (ref 13.0–17.0)
MCH: 29.8 pg (ref 26.0–34.0)
MCHC: 32.3 g/dL (ref 30.0–36.0)
MCV: 92.2 fL (ref 80.0–100.0)
Platelets: 169 10*3/uL (ref 150–400)
RBC: 4.5 MIL/uL (ref 4.22–5.81)
RDW: 11.6 % (ref 11.5–15.5)
WBC: 4.9 10*3/uL (ref 4.0–10.5)
nRBC: 0 % (ref 0.0–0.2)

## 2020-11-30 SURGERY — REMOVAL, HARDWARE
Anesthesia: General | Laterality: Left

## 2020-11-30 MED ORDER — ROCURONIUM BROMIDE 100 MG/10ML IV SOLN
INTRAVENOUS | Status: DC | PRN
  Administered 2020-11-30: 50 mg via INTRAVENOUS

## 2020-11-30 MED ORDER — ORAL CARE MOUTH RINSE: 15.0000 mL | Freq: Once | OROMUCOSAL | Status: AC

## 2020-11-30 MED ORDER — SUGAMMADEX SODIUM 500 MG/5ML IV SOLN
INTRAVENOUS | Status: DC | PRN
  Administered 2020-11-30: 400 mg via INTRAVENOUS

## 2020-11-30 MED ORDER — 0.9 % SODIUM CHLORIDE (POUR BTL) OPTIME
TOPICAL | Status: DC | PRN
  Administered 2020-11-30: 1000 mL

## 2020-11-30 MED ORDER — LIDOCAINE HCL (CARDIAC) PF 100 MG/5ML IV SOSY
PREFILLED_SYRINGE | INTRAVENOUS | Status: DC | PRN
  Administered 2020-11-30: 40 mg via INTRAVENOUS

## 2020-11-30 MED ORDER — EPHEDRINE 5 MG/ML INJ
INTRAVENOUS | Status: AC
  Filled 2020-11-30: qty 5

## 2020-11-30 MED ORDER — MIDAZOLAM HCL 5 MG/5ML IJ SOLN
INTRAMUSCULAR | Status: DC | PRN
  Administered 2020-11-30: 2 mg via INTRAVENOUS

## 2020-11-30 MED ORDER — FENTANYL CITRATE (PF) 250 MCG/5ML IJ SOLN
INTRAMUSCULAR | Status: AC
  Filled 2020-11-30: qty 5

## 2020-11-30 MED ORDER — PROPOFOL 10 MG/ML IV BOLUS
INTRAVENOUS | Status: DC | PRN
  Administered 2020-11-30: 150 mg via INTRAVENOUS

## 2020-11-30 MED ORDER — FENTANYL CITRATE (PF) 100 MCG/2ML IJ SOLN
INTRAMUSCULAR | Status: DC | PRN
  Administered 2020-11-30: 100 ug via INTRAVENOUS

## 2020-11-30 MED ORDER — CHLORHEXIDINE GLUCONATE 0.12 % MT SOLN
15.0000 mL | Freq: Once | OROMUCOSAL | Status: AC
  Administered 2020-11-30: 15 mL via OROMUCOSAL
  Filled 2020-11-30: qty 15

## 2020-11-30 MED ORDER — VANCOMYCIN HCL 1000 MG IV SOLR
INTRAVENOUS | Status: AC
  Filled 2020-11-30: qty 20

## 2020-11-30 MED ORDER — ONDANSETRON HCL 4 MG/2ML IJ SOLN
INTRAMUSCULAR | Status: DC | PRN
  Administered 2020-11-30: 4 mg via INTRAVENOUS

## 2020-11-30 MED ORDER — CEFAZOLIN SODIUM-DEXTROSE 2-4 GM/100ML-% IV SOLN
2.0000 g | INTRAVENOUS | Status: AC
  Administered 2020-11-30: 2 g via INTRAVENOUS
  Filled 2020-11-30: qty 100

## 2020-11-30 MED ORDER — ROCURONIUM BROMIDE 10 MG/ML (PF) SYRINGE
PREFILLED_SYRINGE | INTRAVENOUS | Status: AC
  Filled 2020-11-30: qty 10

## 2020-11-30 MED ORDER — EPHEDRINE SULFATE 50 MG/ML IJ SOLN
INTRAMUSCULAR | Status: DC | PRN
  Administered 2020-11-30: 5 mg via INTRAVENOUS
  Administered 2020-11-30 (×2): 10 mg via INTRAVENOUS

## 2020-11-30 MED ORDER — PROPOFOL 10 MG/ML IV BOLUS
INTRAVENOUS | Status: AC
  Filled 2020-11-30: qty 20

## 2020-11-30 MED ORDER — DEXAMETHASONE SODIUM PHOSPHATE 10 MG/ML IJ SOLN
INTRAMUSCULAR | Status: DC | PRN
  Administered 2020-11-30: 10 mg via INTRAVENOUS

## 2020-11-30 MED ORDER — LIDOCAINE 2% (20 MG/ML) 5 ML SYRINGE
INTRAMUSCULAR | Status: AC
  Filled 2020-11-30: qty 5

## 2020-11-30 MED ORDER — LACTATED RINGERS IV SOLN: INTRAVENOUS | Status: DC

## 2020-11-30 MED ORDER — MIDAZOLAM HCL 2 MG/2ML IJ SOLN
INTRAMUSCULAR | Status: AC
  Filled 2020-11-30: qty 2

## 2020-11-30 SURGICAL SUPPLY — 59 items
BAG COUNTER SPONGE SURGICOUNT (BAG) ×2 IMPLANT
BANDAGE ESMARK 6X9 LF (GAUZE/BANDAGES/DRESSINGS) ×1 IMPLANT
BNDG COHESIVE 6X5 TAN STRL LF (GAUZE/BANDAGES/DRESSINGS) ×2 IMPLANT
BNDG ELASTIC 4X5.8 VLCR STR LF (GAUZE/BANDAGES/DRESSINGS) ×2 IMPLANT
BNDG ELASTIC 6X5.8 VLCR STR LF (GAUZE/BANDAGES/DRESSINGS) ×2 IMPLANT
BNDG ESMARK 6X9 LF (GAUZE/BANDAGES/DRESSINGS) ×2
BNDG GAUZE ELAST 4 BULKY (GAUZE/BANDAGES/DRESSINGS) ×4 IMPLANT
BRUSH SCRUB EZ PLAIN DRY (MISCELLANEOUS) ×4 IMPLANT
CHLORAPREP W/TINT 26 (MISCELLANEOUS) ×2 IMPLANT
COVER SURGICAL LIGHT HANDLE (MISCELLANEOUS) ×2 IMPLANT
CUFF TOURN SGL QUICK 18X4 (TOURNIQUET CUFF) IMPLANT
CUFF TOURN SGL QUICK 24 (TOURNIQUET CUFF)
CUFF TOURN SGL QUICK 34 (TOURNIQUET CUFF)
CUFF TRNQT CYL 24X4X16.5-23 (TOURNIQUET CUFF) IMPLANT
CUFF TRNQT CYL 34X4.125X (TOURNIQUET CUFF) IMPLANT
DRAPE C-ARM 42X72 X-RAY (DRAPES) ×2 IMPLANT
DRAPE C-ARMOR (DRAPES) ×2 IMPLANT
DRAPE U-SHAPE 47X51 STRL (DRAPES) ×2 IMPLANT
DRESSING MEPILEX FLEX 4X4 (GAUZE/BANDAGES/DRESSINGS) ×1 IMPLANT
DRSG ADAPTIC 3X8 NADH LF (GAUZE/BANDAGES/DRESSINGS) ×2 IMPLANT
DRSG MEPILEX FLEX 4X4 (GAUZE/BANDAGES/DRESSINGS) ×2
ELECT REM PT RETURN 9FT ADLT (ELECTROSURGICAL) ×2
ELECTRODE REM PT RTRN 9FT ADLT (ELECTROSURGICAL) ×1 IMPLANT
GAUZE SPONGE 4X4 12PLY STRL (GAUZE/BANDAGES/DRESSINGS) ×2 IMPLANT
GLOVE SURG ENC MOIS LTX SZ6.5 (GLOVE) ×6 IMPLANT
GLOVE SURG ENC MOIS LTX SZ7.5 (GLOVE) ×8 IMPLANT
GLOVE SURG UNDER POLY LF SZ6.5 (GLOVE) ×2 IMPLANT
GLOVE SURG UNDER POLY LF SZ7.5 (GLOVE) ×2 IMPLANT
GOWN STRL REUS W/ TWL LRG LVL3 (GOWN DISPOSABLE) ×2 IMPLANT
GOWN STRL REUS W/TWL LRG LVL3 (GOWN DISPOSABLE) ×2
KIT BASIN OR (CUSTOM PROCEDURE TRAY) ×2 IMPLANT
KIT TURNOVER KIT B (KITS) ×2 IMPLANT
MANIFOLD NEPTUNE II (INSTRUMENTS) IMPLANT
NEEDLE 22X1 1/2 (OR ONLY) (NEEDLE) IMPLANT
NS IRRIG 1000ML POUR BTL (IV SOLUTION) ×2 IMPLANT
PACK ORTHO EXTREMITY (CUSTOM PROCEDURE TRAY) ×2 IMPLANT
PAD ARMBOARD 7.5X6 YLW CONV (MISCELLANEOUS) ×4 IMPLANT
PADDING CAST COTTON 6X4 STRL (CAST SUPPLIES) ×6 IMPLANT
SPONGE T-LAP 18X18 ~~LOC~~+RFID (SPONGE) ×2 IMPLANT
STAPLER VISISTAT 35W (STAPLE) IMPLANT
STOCKINETTE IMPERVIOUS LG (DRAPES) ×2 IMPLANT
STRIP CLOSURE SKIN 1/2X4 (GAUZE/BANDAGES/DRESSINGS) IMPLANT
SUCTION FRAZIER HANDLE 10FR (MISCELLANEOUS)
SUCTION TUBE FRAZIER 10FR DISP (MISCELLANEOUS) IMPLANT
SUT ETHILON 3 0 PS 1 (SUTURE) IMPLANT
SUT MNCRL AB 3-0 PS2 18 (SUTURE) IMPLANT
SUT MON AB 2-0 CT1 36 (SUTURE) IMPLANT
SUT PDS AB 2-0 CT1 27 (SUTURE) IMPLANT
SUT VIC AB 0 CT1 27 (SUTURE)
SUT VIC AB 0 CT1 27XBRD ANBCTR (SUTURE) IMPLANT
SUT VIC AB 2-0 CT1 27 (SUTURE)
SUT VIC AB 2-0 CT1 TAPERPNT 27 (SUTURE) IMPLANT
SYR CONTROL 10ML LL (SYRINGE) IMPLANT
TOWEL GREEN STERILE (TOWEL DISPOSABLE) ×4 IMPLANT
TOWEL GREEN STERILE FF (TOWEL DISPOSABLE) ×4 IMPLANT
TUBE CONNECTING 12X1/4 (SUCTIONS) ×2 IMPLANT
UNDERPAD 30X36 HEAVY ABSORB (UNDERPADS AND DIAPERS) ×2 IMPLANT
WATER STERILE IRR 1000ML POUR (IV SOLUTION) ×4 IMPLANT
YANKAUER SUCT BULB TIP NO VENT (SUCTIONS) ×2 IMPLANT

## 2020-11-30 NOTE — Anesthesia Postprocedure Evaluation (Signed)
Anesthesia Post Note  Patient: Charles Brady  Procedure(s) Performed: HARDWARE REMOVAL LEFT FEMUR (Left)     Patient location during evaluation: PACU Anesthesia Type: General Level of consciousness: awake and alert Pain management: pain level controlled Vital Signs Assessment: post-procedure vital signs reviewed and stable Respiratory status: spontaneous breathing, nonlabored ventilation and respiratory function stable Cardiovascular status: blood pressure returned to baseline and stable Postop Assessment: no apparent nausea or vomiting Anesthetic complications: no   No notable events documented.  Last Vitals:  Vitals:   11/30/20 0853 11/30/20 0908  BP: 134/80 133/80  Pulse: 63 (!) 56  Resp: 13 12  Temp:  (!) 36.1 C  SpO2: 99% 99%    Last Pain:  Vitals:   11/30/20 0908  TempSrc:   PainSc: 0-No pain                 Beryle Lathe
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
## 2020-11-30 NOTE — Discharge Instructions (Addendum)
Orthopaedic Trauma Service Discharge Instructions   General Discharge Instructions  Orthopaedic Injuries:  Left femur fracture  WEIGHT BEARING STATUS:No weightbearing on left leg until told otherwise by Dr. Jena Gauss  RANGE OF MOTION/ACTIVITY:No restrictions  Bone health: None  Wound Care:Remove dressing on 12/02/20 then can leave open to air  DVT/PE prophylaxis:Take a full dose aspirin 325 mg once daily  Diet: as you were eating previously.  Can use over the counter stool softeners and bowel preparations, such as Miralax, to help with bowel movements.  Narcotics can be constipating.  Be sure to drink plenty of fluids  PAIN MEDICATION USE AND EXPECTATIONS  You have likely been given narcotic medications to help control your pain.  After a traumatic event that results in an fracture (broken bone) with or without surgery, it is ok to use narcotic pain medications to help control one's pain.  We understand that everyone responds to pain differently and each individual patient will be evaluated on a regular basis for the continued need for narcotic medications. Ideally, narcotic medication use should last no more than 6-8 weeks (coinciding with fracture healing).   As a patient it is your responsibility as well to monitor narcotic medication use and report the amount and frequency you use these medications when you come to your office visit.   We would also advise that if you are using narcotic medications, you should take a dose prior to therapy to maximize you participation.  IF YOU ARE ON NARCOTIC MEDICATIONS IT IS NOT PERMISSIBLE TO OPERATE A MOTOR VEHICLE (MOTORCYCLE/CAR/TRUCK/MOPED) OR HEAVY MACHINERY DO NOT MIX NARCOTICS WITH OTHER CNS (CENTRAL NERVOUS SYSTEM) DEPRESSANTS SUCH AS ALCOHOL   POST-OPERATIVE OPIOID TAPER INSTRUCTIONS: It is important to wean off of your opioid medication as soon as possible. If you do not need pain medication after your surgery it is ok to stop day  one. Opioids include: Codeine, Hydrocodone(Norco, Vicodin), Oxycodone(Percocet, oxycontin) and hydromorphone amongst others.  Long term and even short term use of opiods can cause: Increased pain response Dependence Constipation Depression Respiratory depression And more.  Withdrawal symptoms can include Flu like symptoms Nausea, vomiting And more Techniques to manage these symptoms Hydrate well Eat regular healthy meals Stay active Use relaxation techniques(deep breathing, meditating, yoga) Do Not substitute Alcohol to help with tapering If you have been on opioids for less than two weeks and do not have pain than it is ok to stop all together.  Plan to wean off of opioids This plan should start within one week post op of your fracture surgery  Maintain the same interval or time between taking each dose and first decrease the dose.  Cut the total daily intake of opioids by one tablet each day Next start to increase the time between doses. The last dose that should be eliminated is the evening dose.    STOP SMOKING OR USING NICOTINE PRODUCTS!!!!  As discussed nicotine severely impairs your body's ability to heal surgical and traumatic wounds but also impairs bone healing.  Wounds and bone heal by forming microscopic blood vessels (angiogenesis) and nicotine is a vasoconstrictor (essentially, shrinks blood vessels).  Therefore, if vasoconstriction occurs to these microscopic blood vessels they essentially disappear and are unable to deliver necessary nutrients to the healing tissue.  This is one modifiable factor that you can do to dramatically increase your chances of healing your injury.    (This means no smoking, no nicotine gum, patches, etc)  DO NOT USE NONSTEROIDAL ANTI-INFLAMMATORY DRUGS (NSAID'S)  Using products such as Advil (ibuprofen), Aleve (naproxen), Motrin (ibuprofen) for additional pain control during fracture healing can delay and/or prevent the healing response.  If  you would like to take over the counter (OTC) medication, Tylenol (acetaminophen) is ok.  However, some narcotic medications that are given for pain control contain acetaminophen as well. Therefore, you should not exceed more than 4000 mg of tylenol in a day if you do not have liver disease.  Also note that there are may OTC medicines, such as cold medicines and allergy medicines that my contain tylenol as well.  If you have any questions about medications and/or interactions please ask your doctor/PA or your pharmacist.      ICE AND ELEVATE INJURED/OPERATIVE EXTREMITY  Using ice and elevating the injured extremity above your heart can help with swelling and pain control.  Icing in a pulsatile fashion, such as 20 minutes on and 20 minutes off, can be followed.    Do not place ice directly on skin. Make sure there is a barrier between to skin and the ice pack.    Using frozen items such as frozen peas works well as the conform nicely to the are that needs to be iced.  USE AN ACE WRAP OR TED HOSE FOR SWELLING CONTROL  In addition to icing and elevation, Ace wraps or TED hose are used to help limit and resolve swelling.  It is recommended to use Ace wraps or TED hose until you are informed to stop.    When using Ace Wraps start the wrapping distally (farthest away from the body) and wrap proximally (closer to the body)   Example: If you had surgery on your leg or thing and you do not have a splint on, start the ace wrap at the toes and work your way up to the thigh        If you had surgery on your upper extremity and do not have a splint on, start the ace wrap at your fingers and work your way up to the upper arm   Call office for the following: Temperature greater than 101F Persistent nausea and vomiting Severe uncontrolled pain Redness, tenderness, or signs of infection (pain, swelling, redness, odor or green/yellow discharge around the site) Difficulty breathing, headache or visual  disturbances Hives Persistent dizziness or light-headedness Extreme fatigue Any other questions or concerns you may have after discharge  In an emergency, call 911 or go to an Emergency Department at a nearby hospital  HELPFUL INFORMATION  If you had a block, it will wear off between 8-24 hrs postop typically.  This is period when your pain may go from nearly zero to the pain you would have had postop without the block.  This is an abrupt transition but nothing dangerous is happening.  You may take an extra dose of narcotic when this happens.  You should wean off your narcotic medicines as soon as you are able.  Most patients will be off or using minimal narcotics before their first postop appointment.   We suggest you use the pain medication the first night prior to going to bed, in order to ease any pain when the anesthesia wears off. You should avoid taking pain medications on an empty stomach as it will make you nauseous.  Do not drink alcoholic beverages or take illicit drugs when taking pain medications.  In most states it is against the law to drive while you are in a splint or sling.  And certainly  against the law to drive while taking narcotics.  You may return to work/school in the next couple of days when you feel up to it.   Pain medication may make you constipated.  Below are a few solutions to try in this order: Decrease the amount of pain medication if you aren't having pain. Drink lots of decaffeinated fluids. Drink prune juice and/or each dried prunes  If the first 3 don't work start with additional solutions Take Colace - an over-the-counter stool softener Take Senokot - an over-the-counter laxative Take Miralax - a stronger over-the-counter laxative     CALL THE OFFICE WITH ANY QUESTIONS OR CONCERNS: [phone]   VISIT OUR WEBSITE FOR ADDITIONAL INFORMATION: [email or web address]      Discharge Wound Care Instructions  Do NOT apply any ointments,  solutions or lotions to pin sites or surgical wounds.  These prevent needed drainage and even though solutions like hydrogen peroxide kill bacteria, they also damage cells lining the pin sites that help fight infection.  Applying lotions or ointments can keep the wounds moist and can cause them to breakdown and open up as well. This can increase the risk for infection. When in doubt call the office.  Surgical incisions should be dressed daily.  If any drainage is noted, use one layer of adaptic, then gauze, Kerlix, and an ace wrap.  Once the incision is completely dry and without drainage, it may be left open to air out.  Showering may begin 36-48 hours later.  Cleaning gently with soap and water.  Traumatic wounds should be dressed daily as well.    One layer of adaptic, gauze, Kerlix, then ace wrap.  The adaptic can be discontinued once the draining has ceased    If you have a wet to dry dressing: wet the gauze with saline the squeeze as much saline out so the gauze is moist (not soaking wet), place moistened gauze over wound, then place a dry gauze over the moist one, followed by Kerlix wrap, then ace wrap.

## 2020-11-30 NOTE — Anesthesia Procedure Notes (Signed)
Procedure Name: Intubation Date/Time: 11/30/2020 7:46 AM Performed by: Shanon Payor, CRNA Pre-anesthesia Checklist: Patient identified Patient Re-evaluated:Patient Re-evaluated prior to induction Oxygen Delivery Method: Circle system utilized Preoxygenation: Pre-oxygenation with 100% oxygen Induction Type: IV induction Ventilation: Mask ventilation without difficulty Laryngoscope Size: Glidescope and 4 Grade View: Grade I Tube type: Oral Tube size: 7.5 mm Number of attempts: 1 Airway Equipment and Method: Stylet Placement Confirmation: ETT inserted through vocal cords under direct vision, positive ETCO2 and breath sounds checked- equal and bilateral Secured at: 23 cm Tube secured with: Tape Dental Injury: Teeth and Oropharynx as per pre-operative assessment

## 2020-11-30 NOTE — Op Note (Signed)
Orthopaedic Surgery Operative Note (CSN: [id number] ) Date of Surgery: 11/30/2020  Admit Date: 11/30/2020   Diagnoses: Pre-Op Diagnoses: Failed distal fixation of left femur  Post-Op Diagnosis: Same  Procedures: CPT 20680-Removal of hardware left distal femur  Surgeons : Primary: Roby Lofts, MD  Assistant: Darron Doom, RNFA  Location: OR 3   Anesthesia:General   Antibiotics: Ancef 2g preop   Tourniquet time:None   Estimated Blood Loss:Minimal  Complications:None   Specimens:None   Implants: * No implants in log *   Indications for Surgery: 50 year old male who sustained a left distal femoral shaft fracture that was treated with retrograde IM nailing in October 08, 2020.  He subsequently went on to have some backing out of one of his distal interlocking screws on his initial follow-up visit.  Earlier this week he had both of his lateral to medial distal interlocking screws backout further causing irritation and prominence over his lateral aspect of his knee.  Due to the prominence and pain that was causing with the noticeable callus that formed I recommended proceeding to the operating room for removal of distal interlocking screws.  Risks and benefits were discussed with the patient and his mother.  Risks included but not limited to bleeding, infection, persistent nonunion, persistent pain, decreased range of motion, swelling, nerve or blood vessel injury, need for further surgery, even the possibility anesthetic complications.  He agreed to proceed with surgery and consent was obtained.  Operative Findings: Removal of lateral to medial distal interlocking screws with stress examination of the femur showing no significant motion at the fracture site  Procedure: The patient was identified in the preoperative holding area. Consent was confirmed with the patient and their family and all questions were answered. The operative extremity was marked after confirmation with the  patient. he was then brought back to the operating room by our anesthesia colleagues.  He was placed under general anesthetic and carefully transferred over to a radiolucent flat top table.  A bump was placed under his operative hip.  The left lower extremity was then prepped and draped in usual sterile fashion.  Timeout was performed to verify the patient, the procedure, and the extremity.  Preoperative antibiotics were dosed.  I made an incision over the interlocking screws distally.  I was able to identify the screws and remove the lateral to medial screws without difficulty.  I then stressed the femur under fluoroscopic guidance which showed no significant deformity or instability.  The oblique screws appeared to be in good position with no signs of failure or backing out.  Final fluoroscopic imaging was obtained.  The incisions were irrigated.  They are closed with 3-0 nylon.  Sterile dressing was applied.  The patient was then awoken from anesthesia and taken to the PACU in stable condition.  Post Op Plan/Instructions: Patient will be touchdown weightbearing to the left lower extremity.  No antibiotics are needed postop.  He will be placed on aspirin for DVT prophylaxis.  He will follow-up in 2 weeks for x-rays and suture removal.  I was present and performed the entire surgery.  Truitt Merle, MD [facility]

## 2020-11-30 NOTE — Progress Notes (Signed)
Orthopedic Tech Progress Note Patient Details:  Charles Brady 1970-03-11 [id number]  Ortho Devices Type of Ortho Device: Crutches Ortho Device/Splint Interventions: Adjustment, Ordered   Post Interventions Patient Tolerated: Well Instructions Provided: Care of device, Poper ambulation with device  Charles Brady Charles Brady 11/30/2020, 9:31 AM

## 2020-11-30 NOTE — Transfer of Care (Signed)
Immediate Anesthesia Transfer of Care Note  Patient: Charles Brady  Procedure(s) Performed: HARDWARE REMOVAL LEFT FEMUR (Left)  Patient Location: PACU  Anesthesia Type:General  Level of Consciousness: awake, alert , oriented and patient cooperative  Airway & Oxygen Therapy: Patient Spontanous Breathing and Patient connected to face mask oxygen  Post-op Assessment: Report given to RN and Post -op Vital signs reviewed and stable  Post vital signs: Reviewed and stable  Last Vitals:  Vitals Value Taken Time  BP 107/61 11/30/20 0823  Temp    Pulse 59 11/30/20 0827  Resp 12 11/30/20 0827  SpO2 100 % 11/30/20 0827  Vitals shown include unvalidated device data.  Last Pain:  Vitals:   11/30/20 0616  TempSrc:   PainSc: 10-Worst pain ever      Patients Stated Pain Goal: 3 (11/30/20 3536)  Complications: No notable events documented.

## 2020-12-01 ENCOUNTER — Encounter (HOSPITAL_COMMUNITY): Payer: Self-pay | Admitting: Student

## 2022-06-17 ENCOUNTER — Encounter: Payer: Self-pay | Admitting: *Deleted
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
## 2023-01-25 IMAGING — RF DG FEMUR 2+V*L*
1 series · 2 of 2 positions shown · non-contrast
Comparison: 10/08/2020

CLINICAL DATA: Hardware removal left femur

EXAM:
LEFT FEMUR 2 VIEWS

[Series 1: run · 2 of 2 slices shown]
[im 1/2]
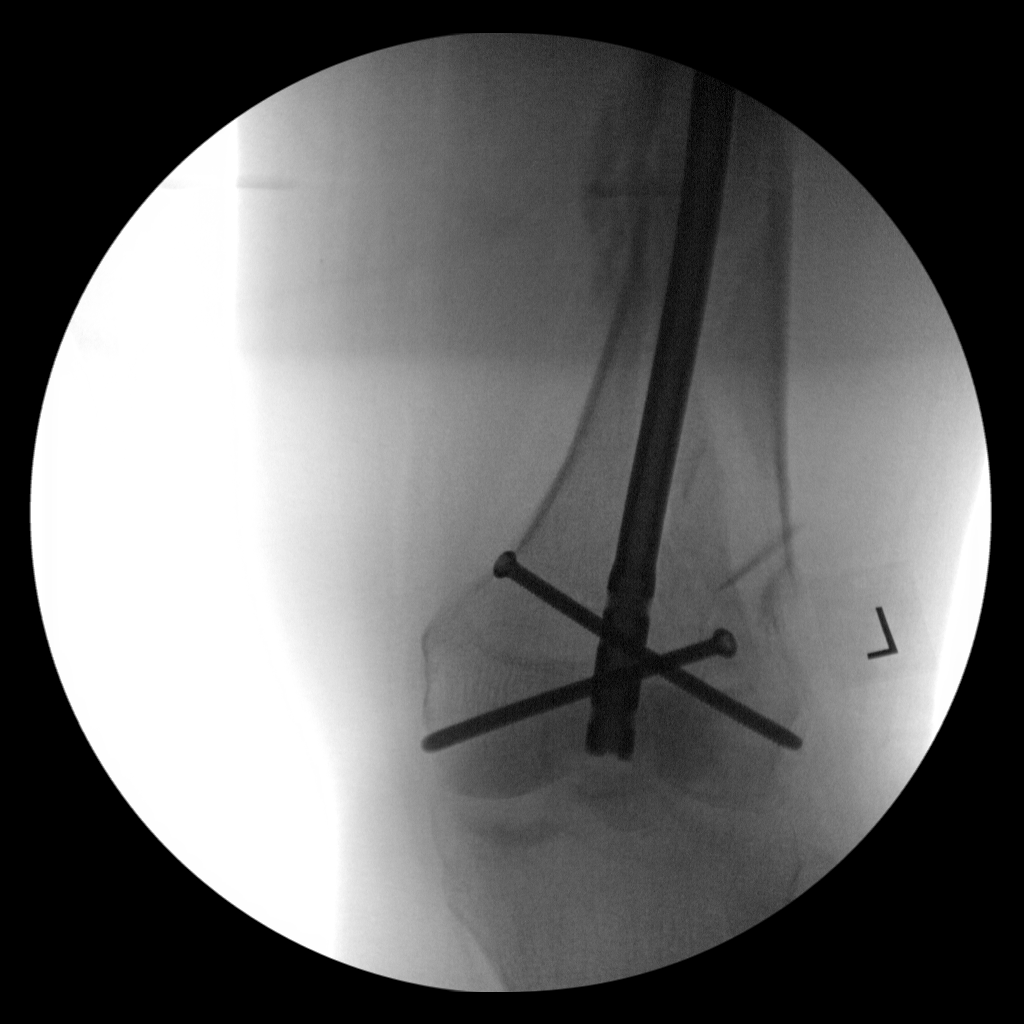
[im 2/2]
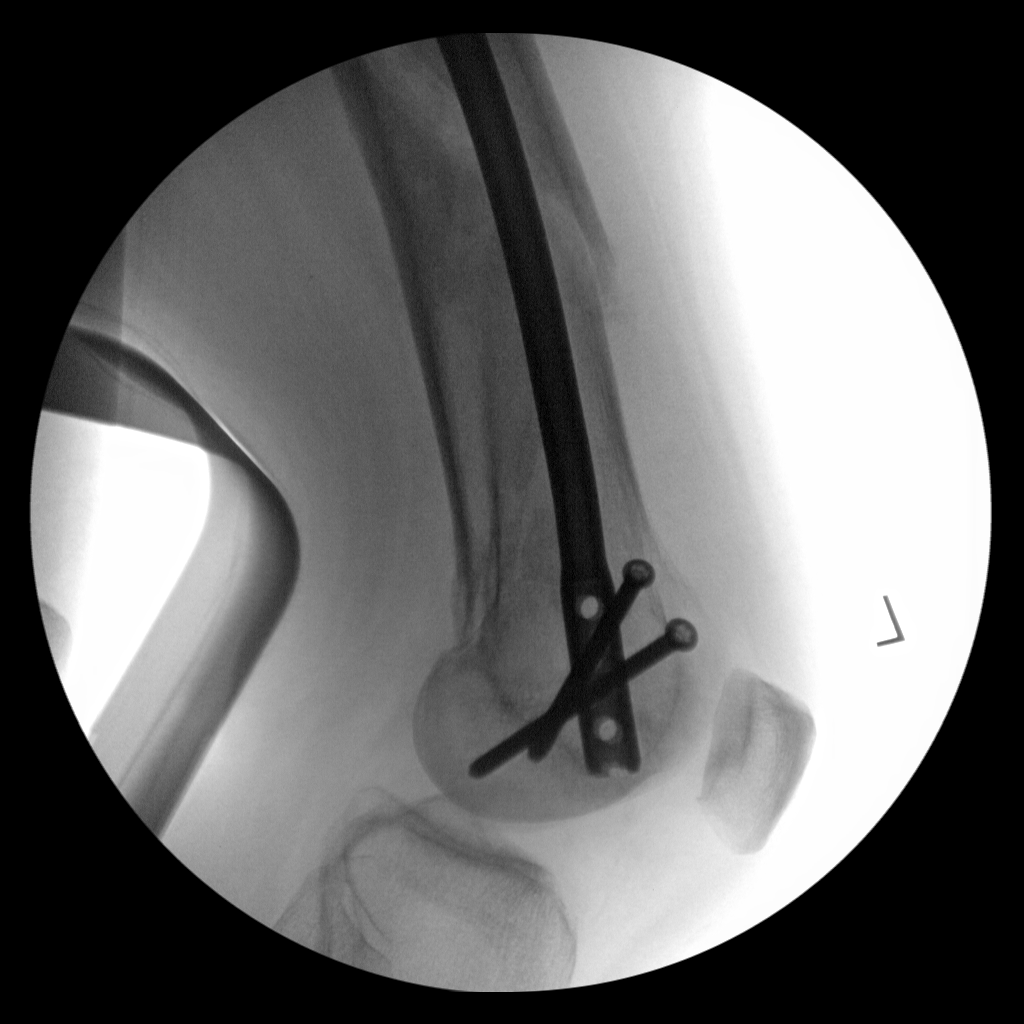

[2 of 2 positions shown; findings below may reference images not displayed]

FINDINGS: Two intraoperative spot images demonstrate intramedullary nail and
distal screws within the visualized distal left femur. Partially
healed distal femoral fracture.
IMPRESSION: As above.
